# Patient Record
Sex: Male | Born: 1965 | Race: White | Hispanic: No | Smoking: Former smoker
Health system: Southern US, Community
[De-identification: ages and names within clinical notes are randomized; demographics above are authoritative.]

## PROBLEM LIST (undated history)

## (undated) DIAGNOSIS — K759 Inflammatory liver disease, unspecified: Secondary | ICD-10-CM

## (undated) DIAGNOSIS — Z9889 Other specified postprocedural states: Secondary | ICD-10-CM

## (undated) DIAGNOSIS — Z87442 Personal history of urinary calculi: Secondary | ICD-10-CM

## (undated) HISTORY — PX: COLONOSCOPY: SHX174

## (undated) HISTORY — PX: OTHER SURGICAL HISTORY: SHX169

---

## 1998-09-12 ENCOUNTER — Emergency Department (HOSPITAL_COMMUNITY): Admission: EM | Admit: 1998-09-12 | Discharge: 1998-09-12 | Payer: Self-pay

## 2010-03-22 ENCOUNTER — Emergency Department (HOSPITAL_COMMUNITY): Admission: EM | Admit: 2010-03-22 | Discharge: 2010-03-22 | Payer: Self-pay | Admitting: Family Medicine

## 2010-03-26 ENCOUNTER — Observation Stay (HOSPITAL_COMMUNITY): Admission: EM | Admit: 2010-03-26 | Discharge: 2010-03-27 | Payer: Self-pay | Admitting: Emergency Medicine

## 2010-03-26 ENCOUNTER — Emergency Department (HOSPITAL_COMMUNITY): Admission: EM | Admit: 2010-03-26 | Discharge: 2010-03-26 | Payer: Self-pay | Admitting: Family Medicine

## 2010-03-27 ENCOUNTER — Encounter (HOSPITAL_BASED_OUTPATIENT_CLINIC_OR_DEPARTMENT_OTHER): Admission: RE | Admit: 2010-03-27 | Discharge: 2010-04-27 | Payer: Self-pay | Admitting: General Surgery

## 2010-10-13 LAB — POCT I-STAT, CHEM 8
BUN: 17 mg/dL (ref 6–23)
Calcium, Ion: 1.18 mmol/L (ref 1.12–1.32)
Chloride: 107 mEq/L (ref 96–112)
Creatinine, Ser: 1.2 mg/dL (ref 0.4–1.5)
Glucose, Bld: 94 mg/dL (ref 70–99)
HCT: 50 % (ref 39.0–52.0)
Hemoglobin: 17 g/dL (ref 13.0–17.0)
Potassium: 4.6 mEq/L (ref 3.5–5.1)
Sodium: 140 mEq/L (ref 135–145)
TCO2: 27 mmol/L (ref 0–100)

## 2010-10-13 LAB — DIFFERENTIAL
Basophils Absolute: 0 10*3/uL (ref 0.0–0.1)
Basophils Relative: 0 % (ref 0–1)
Eosinophils Absolute: 0.1 10*3/uL (ref 0.0–0.7)
Eosinophils Relative: 1 % (ref 0–5)
Lymphocytes Relative: 19 % (ref 12–46)
Lymphs Abs: 2.2 10*3/uL (ref 0.7–4.0)
Monocytes Absolute: 1.6 10*3/uL — ABNORMAL HIGH (ref 0.1–1.0)
Monocytes Relative: 14 % — ABNORMAL HIGH (ref 3–12)
Neutro Abs: 7.6 10*3/uL (ref 1.7–7.7)
Neutrophils Relative %: 66 % (ref 43–77)

## 2010-10-13 LAB — CBC
HCT: 46.9 % (ref 39.0–52.0)
Hemoglobin: 16.4 g/dL (ref 13.0–17.0)
MCH: 33.5 pg (ref 26.0–34.0)
MCHC: 35 g/dL (ref 30.0–36.0)
MCV: 95.9 fL (ref 78.0–100.0)
Platelets: 237 10*3/uL (ref 150–400)
RBC: 4.89 MIL/uL (ref 4.22–5.81)
RDW: 13 % (ref 11.5–15.5)
WBC: 11.5 10*3/uL — ABNORMAL HIGH (ref 4.0–10.5)

## 2013-05-23 ENCOUNTER — Emergency Department (HOSPITAL_COMMUNITY)
Admission: EM | Admit: 2013-05-23 | Discharge: 2013-05-23 | Disposition: A | Discharge status: Home / Self Care | Payer: Self-pay | Attending: Emergency Medicine | Admitting: Emergency Medicine

## 2013-05-23 ENCOUNTER — Emergency Department (HOSPITAL_COMMUNITY): Payer: Self-pay

## 2013-05-23 ENCOUNTER — Encounter (HOSPITAL_COMMUNITY): Payer: Self-pay | Admitting: Emergency Medicine

## 2013-05-23 DIAGNOSIS — N200 Calculus of kidney: Secondary | ICD-10-CM | POA: Insufficient documentation

## 2013-05-23 DIAGNOSIS — F17200 Nicotine dependence, unspecified, uncomplicated: Secondary | ICD-10-CM | POA: Insufficient documentation

## 2013-05-23 LAB — URINALYSIS W MICROSCOPIC + REFLEX CULTURE
Glucose, UA: NEGATIVE mg/dL
Ketones, ur: NEGATIVE mg/dL
Nitrite: NEGATIVE
Protein, ur: 100 mg/dL — AB
Specific Gravity, Urine: 1.038 — ABNORMAL HIGH (ref 1.005–1.030)
Urobilinogen, UA: 1 mg/dL (ref 0.0–1.0)
pH: 6 (ref 5.0–8.0)

## 2013-05-23 LAB — COMPREHENSIVE METABOLIC PANEL
ALT: 42 U/L (ref 0–53)
Alkaline Phosphatase: 96 U/L (ref 39–117)
BUN: 18 mg/dL (ref 6–23)
CO2: 23 mEq/L (ref 19–32)
Calcium: 9.7 mg/dL (ref 8.4–10.5)
GFR calc Af Amer: 81 mL/min — ABNORMAL LOW (ref >90)
GFR calc non Af Amer: 70 mL/min — ABNORMAL LOW (ref >90)
Glucose, Bld: 140 mg/dL — ABNORMAL HIGH (ref 70–99)
Potassium: 4.8 mEq/L (ref 3.5–5.1)
Sodium: 137 mEq/L (ref 135–145)
Total Protein: 7.5 g/dL (ref 6.0–8.3)

## 2013-05-23 LAB — CBC WITH DIFFERENTIAL/PLATELET
Basophils Absolute: 0 10*3/uL (ref 0.0–0.1)
Basophils Relative: 0 % (ref 0–1)
Eosinophils Absolute: 0 10*3/uL (ref 0.0–0.7)
Eosinophils Relative: 0 % (ref 0–5)
HCT: 49.1 % (ref 39.0–52.0)
Hemoglobin: 17.2 g/dL — ABNORMAL HIGH (ref 13.0–17.0)
Lymphs Abs: 2.2 10*3/uL (ref 0.7–4.0)
MCH: 33.6 pg (ref 26.0–34.0)
MCHC: 35 g/dL (ref 30.0–36.0)
MCV: 95.9 fL (ref 78.0–100.0)
Monocytes Absolute: 1.1 10*3/uL — ABNORMAL HIGH (ref 0.1–1.0)
Neutro Abs: 7.8 10*3/uL — ABNORMAL HIGH (ref 1.7–7.7)
Neutrophils Relative %: 70 % (ref 43–77)
Platelets: 255 10*3/uL (ref 150–400)
RBC: 5.12 MIL/uL (ref 4.22–5.81)
RDW: 13.2 % (ref 11.5–15.5)
WBC: 11.1 10*3/uL — ABNORMAL HIGH (ref 4.0–10.5)

## 2013-05-23 MED ORDER — ONDANSETRON 8 MG PO TBDP
8.0000 mg | ORAL_TABLET | Freq: Once | ORAL | Status: AC
  Administered 2013-05-23: 8 mg via ORAL
  Filled 2013-05-23: qty 1

## 2013-05-23 MED ORDER — SODIUM CHLORIDE 0.9 % IV SOLN
1000.0000 mL | Freq: Once | INTRAVENOUS | Status: AC
Start: 2013-05-23 — End: 2013-05-23
  Administered 2013-05-23: 1000 mL via INTRAVENOUS

## 2013-05-23 MED ORDER — OXYCODONE-ACETAMINOPHEN 5-325 MG PO TABS
1.0000 | ORAL_TABLET | Freq: Once | ORAL | Status: AC
  Administered 2013-05-23: 1 via ORAL
  Filled 2013-05-23: qty 1

## 2013-05-23 MED ORDER — HYDROMORPHONE HCL PF 1 MG/ML IJ SOLN
1.0000 mg | Freq: Once | INTRAMUSCULAR | Status: AC
  Administered 2013-05-23: 1 mg via INTRAVENOUS
  Filled 2013-05-23: qty 1

## 2013-05-23 MED ORDER — OXYCODONE-ACETAMINOPHEN 5-325 MG PO TABS: 1.0000 | ORAL_TABLET | ORAL | Status: DC | PRN

## 2013-05-23 MED ORDER — IBUPROFEN 800 MG PO TABS: 800.0000 mg | ORAL_TABLET | Freq: Three times a day (TID) | ORAL | Status: DC

## 2013-05-23 MED ORDER — TAMSULOSIN HCL 0.4 MG PO CAPS: 0.4000 mg | ORAL_CAPSULE | Freq: Every day | ORAL | Status: DC

## 2013-05-23 MED ORDER — KETOROLAC TROMETHAMINE 30 MG/ML IJ SOLN
30.0000 mg | Freq: Once | INTRAMUSCULAR | Status: AC
  Administered 2013-05-23: 30 mg via INTRAVENOUS
  Filled 2013-05-23: qty 1

## 2013-05-23 MED ORDER — TAMSULOSIN HCL 0.4 MG PO CAPS
0.4000 mg | ORAL_CAPSULE | Freq: Once | ORAL | Status: AC
  Administered 2013-05-23: 0.4 mg via ORAL
  Filled 2013-05-23: qty 1

## 2013-05-23 NOTE — ED Provider Notes (Signed)
CSN: [id number]     Arrival date & time 05/23/13  0820 History   First MD Initiated Contact with Patient 05/23/13 [phone]     Chief Complaint  Patient presents with  . Flank Pain  . Back Pain   (Consider location/radiation/quality/duration/timing/severity/associated sxs/prior Treatment) HPI Mathew Sexton is a 47 y.o. male who presents to emergency department with complaint of right flank pain. Patient states that he was asleep and was awoken by severe sharp pain in the right lower back. States pain does not radiate although he did tell the nurse at some point that he did have pain in his right groin. Patient states she had associated nausea and vomiting. Denies any diarrhea. Denies any urinary symptoms however states his urine did look dark. Patient states pain is constant. States it is worse with movement and palpation of his abdomen however he denies any abdominal pain. Patient denies any back injuries or back pain radiating down to his legs. States that he did bend over a lot yesterday and played portable. States no pain prior to going to bed. Patient also states that he has family history of kidney stones. Patient did not take any medications prior to coming in   History reviewed. No pertinent past medical history. History reviewed. No pertinent past surgical history. No family history on file. History  Substance Use Topics  . Smoking status: Current Every Day Smoker -- 1.00 packs/day    Types: Cigarettes  . Smokeless tobacco: Never Used  . Alcohol Use: No    Review of Systems  Constitutional: Negative for fever and chills.  Respiratory: Negative for cough, chest tightness and shortness of breath.   Cardiovascular: Negative for chest pain, palpitations and leg swelling.  Gastrointestinal: Positive for nausea and vomiting. Negative for abdominal pain, diarrhea and abdominal distention.  Genitourinary: Positive for flank pain. Negative for dysuria, urgency, frequency, hematuria, scrotal  swelling and testicular pain.  Musculoskeletal: Negative for arthralgias, myalgias, neck pain and neck stiffness.  Skin: Negative for rash.  Allergic/Immunologic: Negative for immunocompromised state.  Neurological: Negative for dizziness, weakness, light-headedness, numbness and headaches.    Allergies  Review of patient's allergies indicates no known allergies.  Home Medications   Current Outpatient Rx  Name  Route  Sig  Dispense  Refill  . acetaminophen (TYLENOL) 325 MG tablet   Oral   Take 650 mg by mouth every 6 (six) hours as needed for pain.         Marland Kitchen ibuprofen (ADVIL,MOTRIN) 200 MG tablet   Oral   Take 400 mg by mouth every 6 (six) hours as needed for pain.          BP 140/74  Pulse 60  Temp(Src) 97.7 F (36.5 C)  Resp 24  SpO2 99% Physical Exam  Nursing note and vitals reviewed. Constitutional: He is oriented to person, place, and time. He appears well-developed and well-nourished. No distress.  HENT:  Head: Normocephalic.  Eyes: Conjunctivae are normal.  Neck: Neck supple.  Cardiovascular: Normal rate, regular rhythm and normal heart sounds.   Pulmonary/Chest: Effort normal and breath sounds normal. No respiratory distress. He has no wheezes. He has no rales.  Abdominal: Soft. Bowel sounds are normal. He exhibits no distension. There is no tenderness. There is no rebound.  Right CVA tenderness  Musculoskeletal:  The pain moved bilateral straight legraise  Neurological: He is alert and oriented to person, place, and time.    ED Course  Procedures (including critical care time) Labs Review Labs  Reviewed  URINALYSIS W MICROSCOPIC + REFLEX CULTURE - Abnormal; Notable for the following:    Color, Urine RED (*)    APPearance TURBID (*)    Specific Gravity, Urine 1.038 (*)    Hgb urine dipstick LARGE (*)    Bilirubin Urine MODERATE (*)    Protein, ur 100 (*)    Leukocytes, UA SMALL (*)    Bacteria, UA MANY (*)    All other components within normal  limits  CBC WITH DIFFERENTIAL - Abnormal; Notable for the following:    WBC 11.1 (*)    Hemoglobin 17.2 (*)    Neutro Abs 7.8 (*)    Monocytes Absolute 1.1 (*)    All other components within normal limits  COMPREHENSIVE METABOLIC PANEL - Abnormal; Notable for the following:    Glucose, Bld 140 (*)    AST 39 (*)    GFR calc non Af Amer 70 (*)    GFR calc Af Amer 81 (*)    All other components within normal limits   Imaging Review Ct Abdomen Pelvis Wo Contrast  05/23/2013   CLINICAL DATA:  Right flank pain  EXAM: CT ABDOMEN AND PELVIS WITHOUT CONTRAST  TECHNIQUE: Multidetector CT imaging of the abdomen and pelvis was performed following the standard protocol without intravenous contrast.  COMPARISON:  None.  FINDINGS: Bilateral lower lobe mild atelectasis. No acute musculoskeletal abnormalities.  Liver and gallbladder are normal. Spleen is normal. Pancreas and adrenal glands are normal. Left kidney is normal. Bladder is normal. There is a small hiatal hernia. Aorta and bowel appear normal. Reproductive organs are normal. No significant adenopathy. No ascites.  There is a 2 mm stone in the right renal midpole. There is mild right perinephric inflammatory change. There is mild dilatation of the collecting system and mild to moderate dilatation of the right ureter. There is mild periureteral inflammatory change. There is a calculus at the right ureteral vesicle junction, measuring 5 mm. The appendix appears normal. There is mild bilateral hip arthritis.  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 There is a nonobstructing 2 mm renal calculus on the right as well.   Electronically Signed   By: Esperanza Heir M.D.   On: 05/23/2013 10:30    EKG Interpretation   None       MDM   1. Kidney stone     Patient with a right lower back and flank pain. No history of the same. No recent back injuries. No urinary symptoms.  Patient's urine did have a large hemoglobin CT of the abdomen and pelvis obtained and showed a 5 mm stone at the UVJ with mild to moderate obstructive nephropathy. Will get pain controlled. Pt is afebrile, ua showed many bacteria, clinically no signs of infection at this time. Urine cultures sent.   12:15 PM Patient received several doses of IV Dilaudid for pain, he does of IV Toradol, oral Percocet. His pain is minimal at this time. States pain is 1/10. He is comfortable going home. Will refer to urology  Home with Flomax, ibuprofen, Percocet.  Filed Vitals:   05/23/13 0827 05/23/13 1002  BP: 140/74 124/71  Pulse: 60 59  Temp: 97.7 F (36.5 C) 98 F (36.7 C)  TempSrc:  Oral  Resp: 24   SpO2: 99% 95%       Nobel Brar A Dam Ashraf, PA-C 05/23/13 1524

## 2013-05-23 NOTE — ED Notes (Signed)
Pain is more so in back

## 2013-05-23 NOTE — ED Notes (Addendum)
Pt given urinal and made aware of need for UA instructed to ring call bell when urine obtained

## 2013-05-23 NOTE — ED Notes (Signed)
MD at bedside. 

## 2013-05-23 NOTE — ED Notes (Signed)
Patient complaining of right back and flank pain. No hx of kidney stones. However, patient states that his father has had them.

## 2013-05-23 NOTE — ED Provider Notes (Signed)
Medical screening examination/treatment/procedure(s) were performed by non-physician practitioner and as supervising physician I was immediately available for consultation/collaboration.  Flint Melter, MD 05/23/13 1600

## 2014-08-02 ENCOUNTER — Other Ambulatory Visit (HOSPITAL_COMMUNITY): Payer: Self-pay | Admitting: Nurse Practitioner

## 2014-08-02 DIAGNOSIS — B1920 Unspecified viral hepatitis C without hepatic coma: Secondary | ICD-10-CM

## 2014-08-18 ENCOUNTER — Ambulatory Visit (HOSPITAL_COMMUNITY)
Admission: RE | Admit: 2014-08-18 | Discharge: 2014-08-18 | Disposition: A | Discharge status: Home / Self Care | Payer: BLUE CROSS/BLUE SHIELD | Source: Ambulatory Visit | Attending: Nurse Practitioner | Admitting: Nurse Practitioner

## 2014-08-18 DIAGNOSIS — K760 Fatty (change of) liver, not elsewhere classified: Secondary | ICD-10-CM | POA: Insufficient documentation

## 2014-08-18 DIAGNOSIS — B1920 Unspecified viral hepatitis C without hepatic coma: Secondary | ICD-10-CM | POA: Insufficient documentation

## 2014-12-26 IMAGING — CT CT ABD-PELV W/O CM
1 series · 15 of 28 positions shown, 19 images · non-contrast
Comparison: None.

CLINICAL DATA: Right flank pain

EXAM:
CT ABDOMEN AND PELVIS WITHOUT CONTRAST
TECHNIQUE: Multidetector CT imaging of the abdomen and pelvis was performed
following the standard protocol without intravenous contrast.

[Series 4: lung · axial · 0.83mm/px · z∈[-188,-68]mm · 15 of 28 slices shown, 19 images]
[im 3/28  soft-tissue]
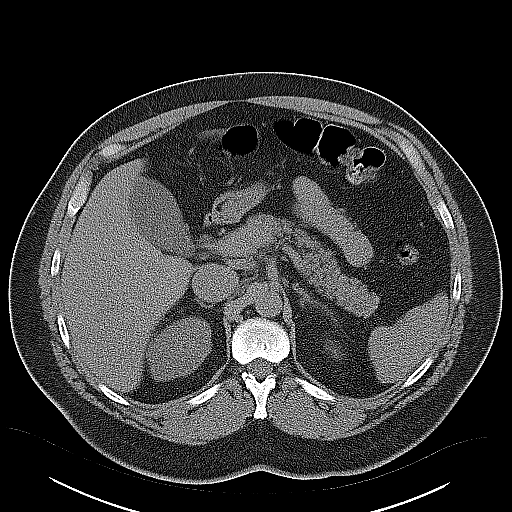
[im 3/28  bone]
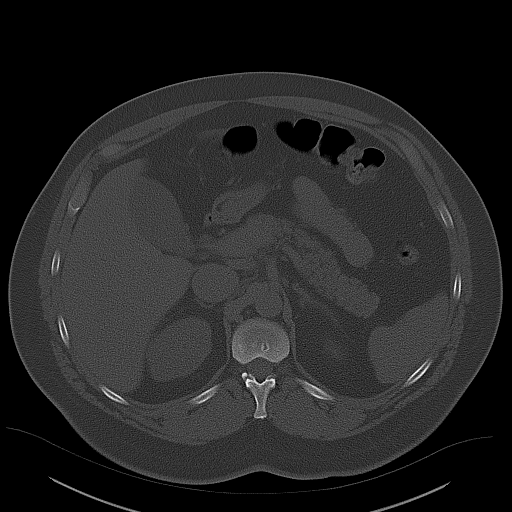
[im 5/28  soft-tissue]
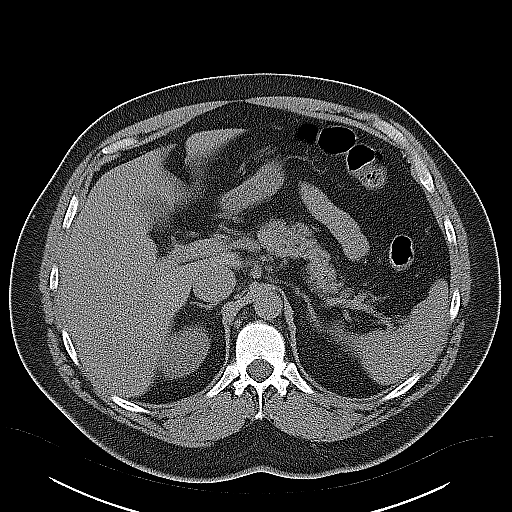
[im 7/28  soft-tissue]
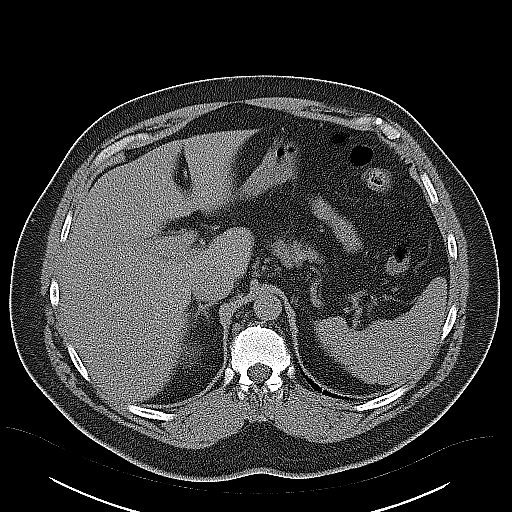
[im 9/28  soft-tissue]
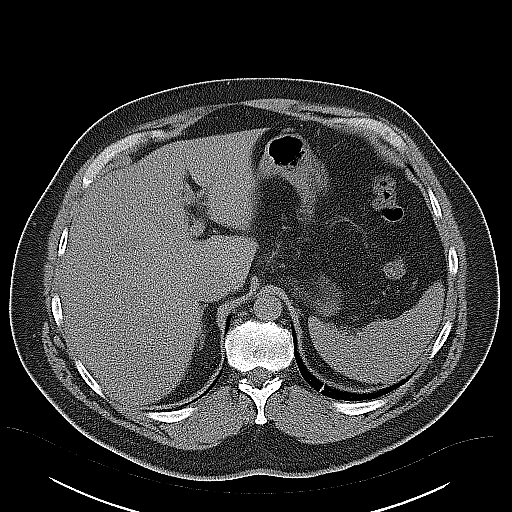
[im 11/28  soft-tissue]
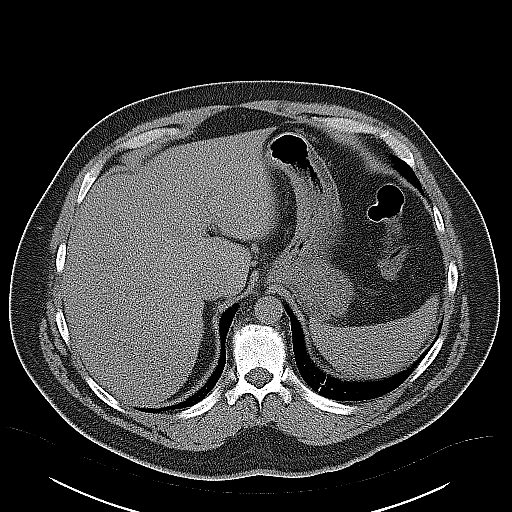
[im 13/28  soft-tissue]
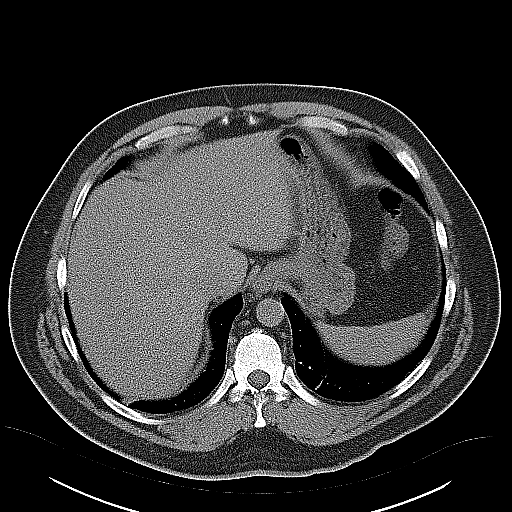
[im 15/28  soft-tissue]
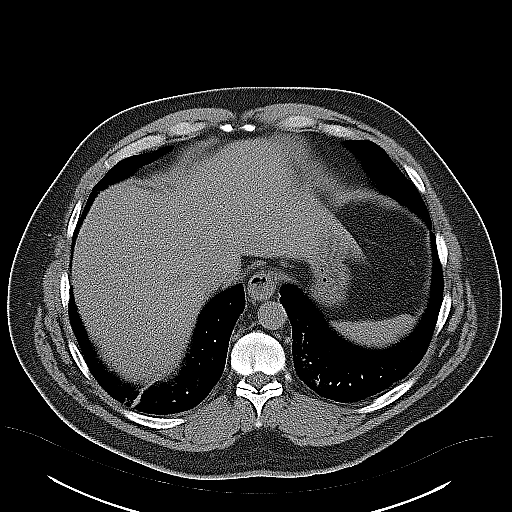
[im 17/28  soft-tissue]
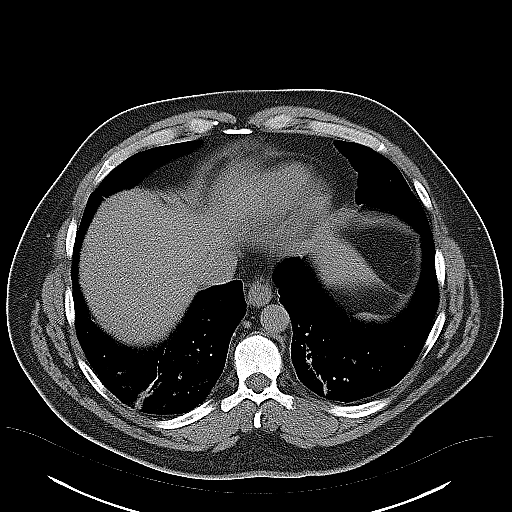
[im 19/28  soft-tissue]
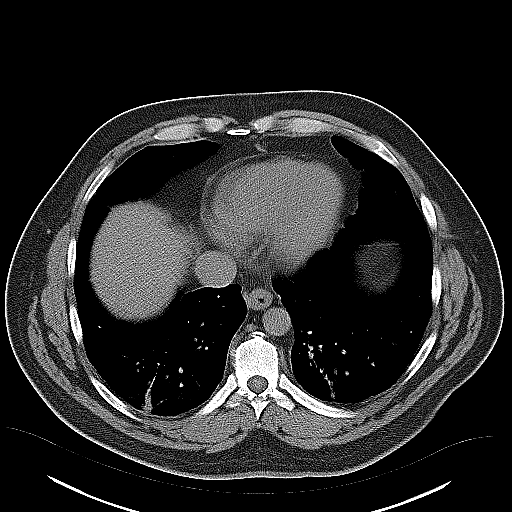
[im 19/28  bone]
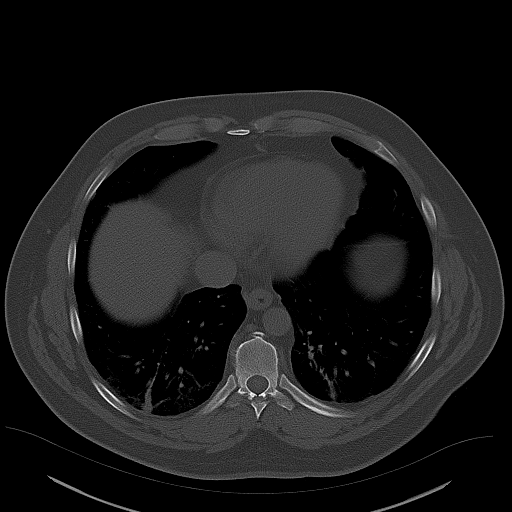
[im 21/28  soft-tissue]
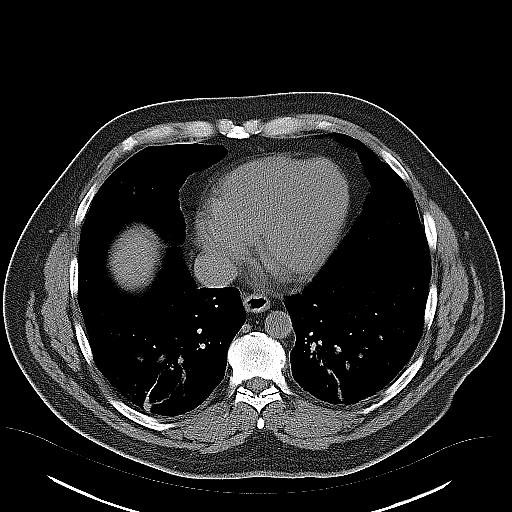
[im 23/28  soft-tissue]
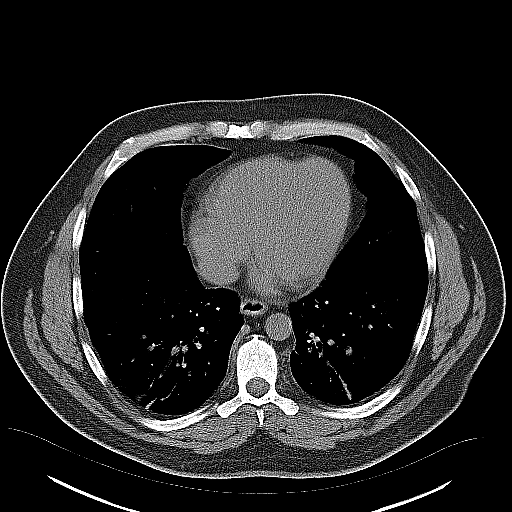
[im 24/28  lung]
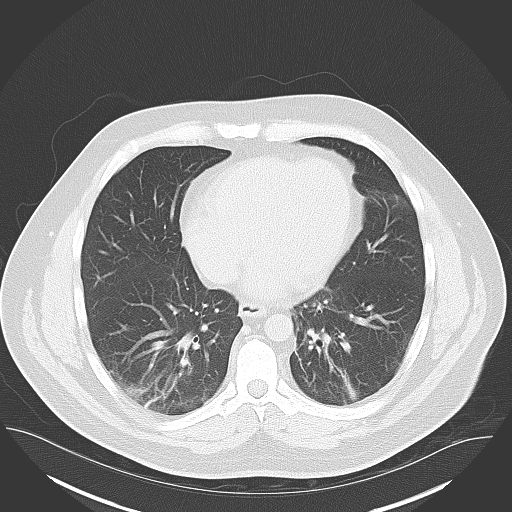
[im 25/28  soft-tissue]
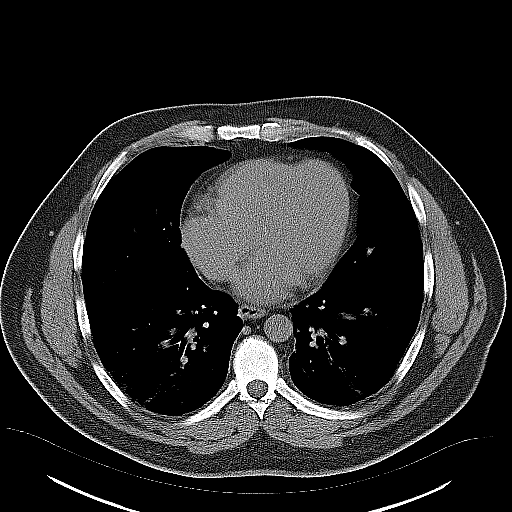
[im 25/28  lung]
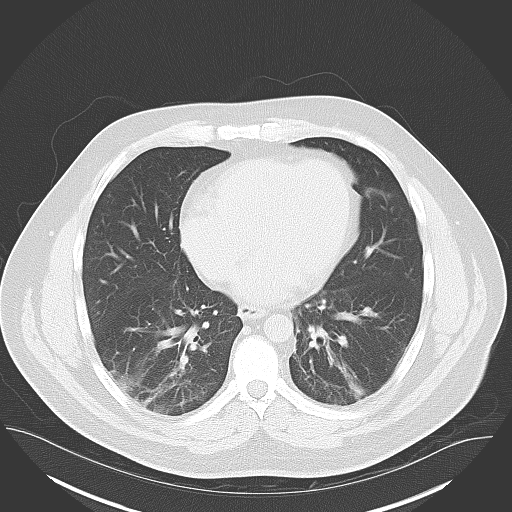
[im 26/28  lung]
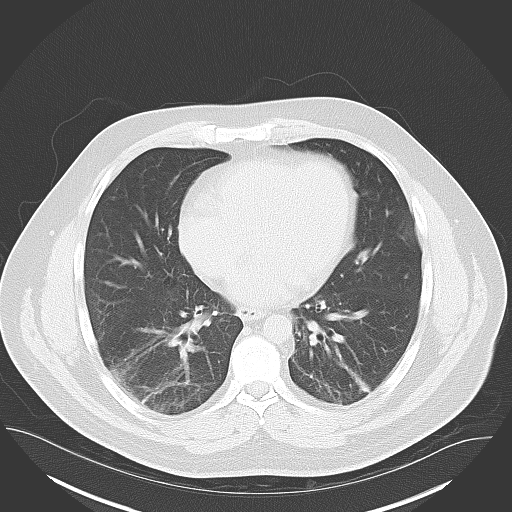
[im 27/28  soft-tissue]
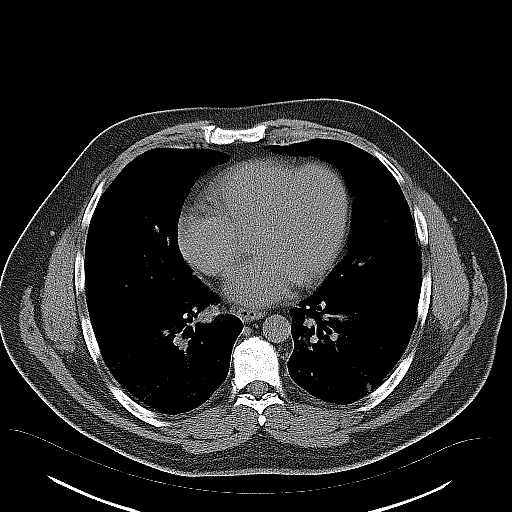
[im 27/28  lung]
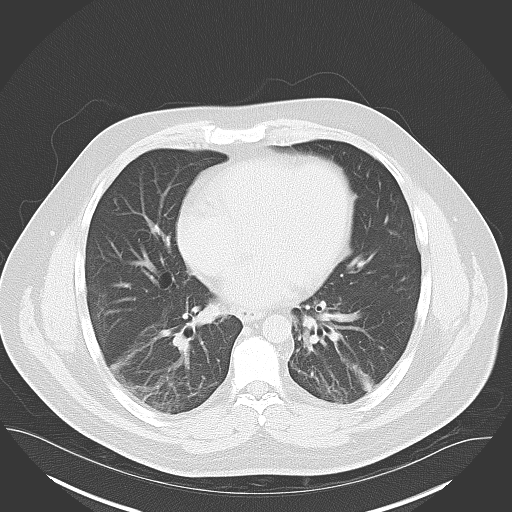

[15 of 28 positions shown; findings below may reference images not displayed]

FINDINGS: Bilateral lower lobe mild atelectasis. No acute musculoskeletal
abnormalities.

Liver and gallbladder are normal. Spleen is normal. Pancreas and
adrenal glands are normal. Left kidney is normal. Bladder is normal.
There is a small hiatal hernia. Aorta and bowel appear normal.
Reproductive organs are normal. No significant adenopathy. No
ascites.

There is a 2 mm stone in the right renal midpole. There is mild
right perinephric inflammatory change. There is mild dilatation of
the collecting system and mild to moderate dilatation of the right
ureter. There is mild periureteral inflammatory change. There is a
calculus at the right ureteral vesicle junction, measuring 5 mm. The
appendix appears normal. There is mild bilateral hip arthritis.
IMPRESSION: Mild to moderate obstructive nephropathy on the right due to a 5 mm
stone at the ureterovesical junction. There is a nonobstructing 2 mm
renal calculus on the right as well.

## 2015-05-13 ENCOUNTER — Other Ambulatory Visit: Payer: Self-pay | Admitting: Nurse Practitioner

## 2015-05-13 DIAGNOSIS — K7469 Other cirrhosis of liver: Secondary | ICD-10-CM

## 2015-05-24 ENCOUNTER — Ambulatory Visit
Admission: RE | Admit: 2015-05-24 | Discharge: 2015-05-24 | Disposition: A | Discharge status: Home / Self Care | Payer: BLUE CROSS/BLUE SHIELD | Source: Ambulatory Visit | Attending: Nurse Practitioner | Admitting: Nurse Practitioner

## 2015-05-24 DIAGNOSIS — K7469 Other cirrhosis of liver: Secondary | ICD-10-CM

## 2015-11-09 ENCOUNTER — Other Ambulatory Visit: Payer: Self-pay | Admitting: Nurse Practitioner

## 2015-11-09 DIAGNOSIS — K7469 Other cirrhosis of liver: Secondary | ICD-10-CM

## 2015-11-09 DIAGNOSIS — B182 Chronic viral hepatitis C: Secondary | ICD-10-CM | POA: Diagnosis not present

## 2015-11-09 DIAGNOSIS — K740 Hepatic fibrosis: Secondary | ICD-10-CM | POA: Diagnosis not present

## 2015-11-18 ENCOUNTER — Other Ambulatory Visit: Payer: BLUE CROSS/BLUE SHIELD

## 2016-03-22 IMAGING — US US ABDOMEN COMPLETE W/ ELASTOGRAPHY
1 series · 13 of 25 positions shown · non-contrast
Comparison: 05/23/2013

CLINICAL DATA: Hepatitis-C



[Series 1: us abdomen complete w/ elastography · 0.35mm/px · 13 of 82 slices shown]
[im 1/82]
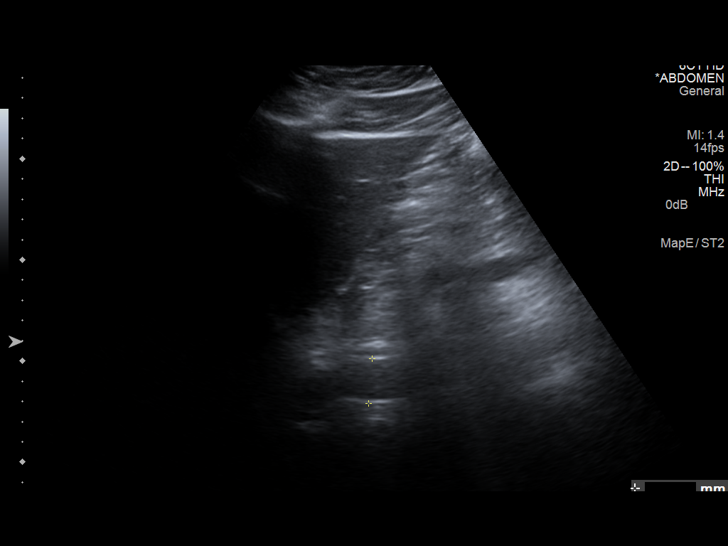
[im 7/82]
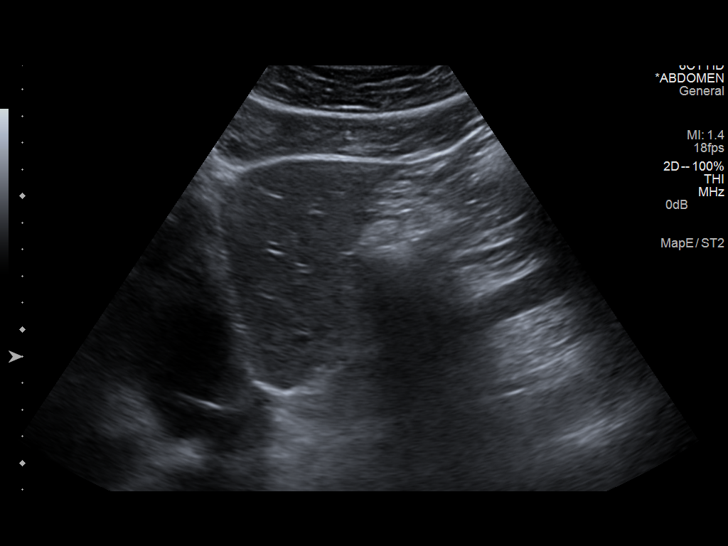
[im 14/82]
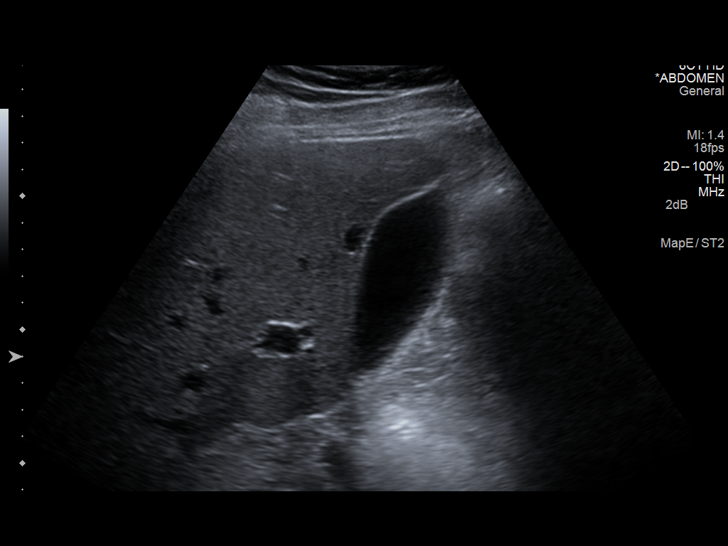
[im 21/82]
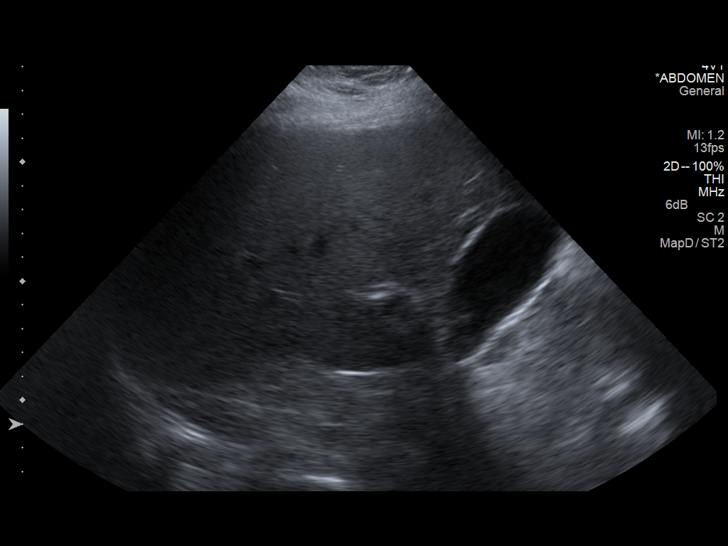
[im 28/82]
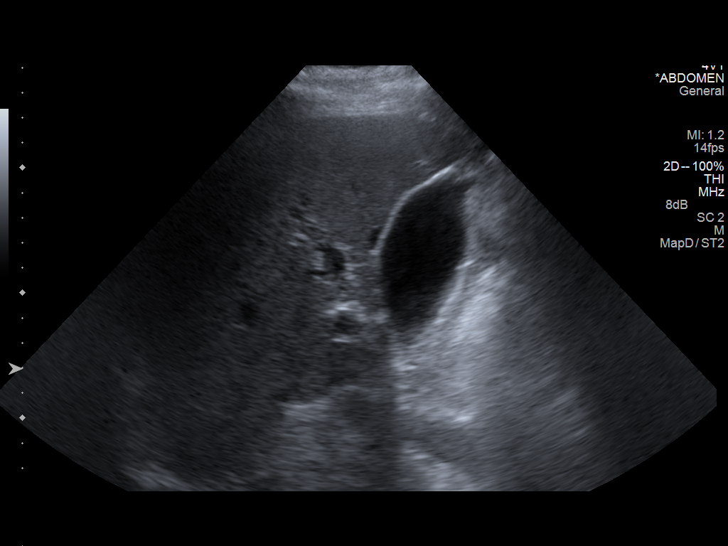
[im 34/82]
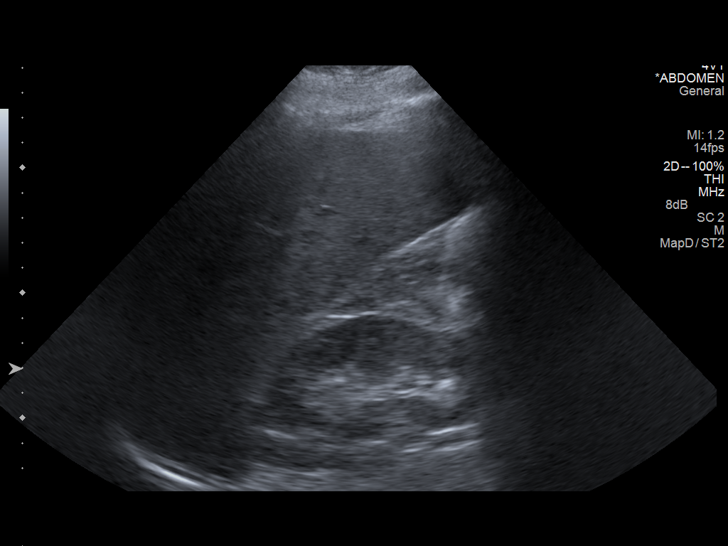
[im 41/82]
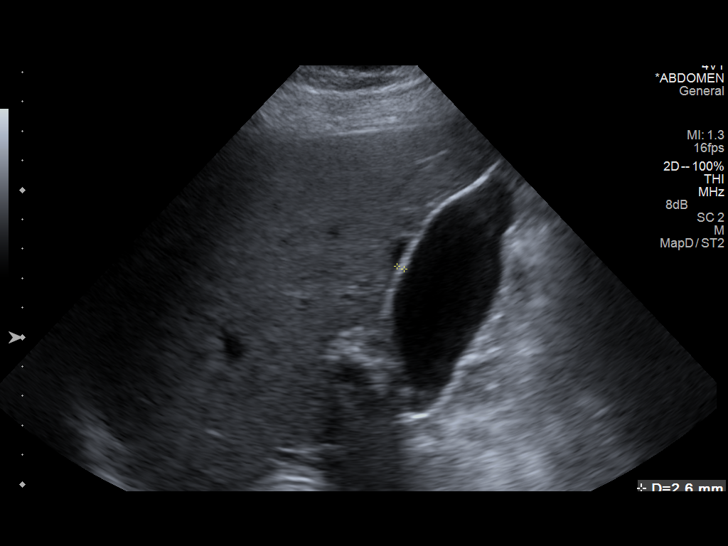
[im 48/82]
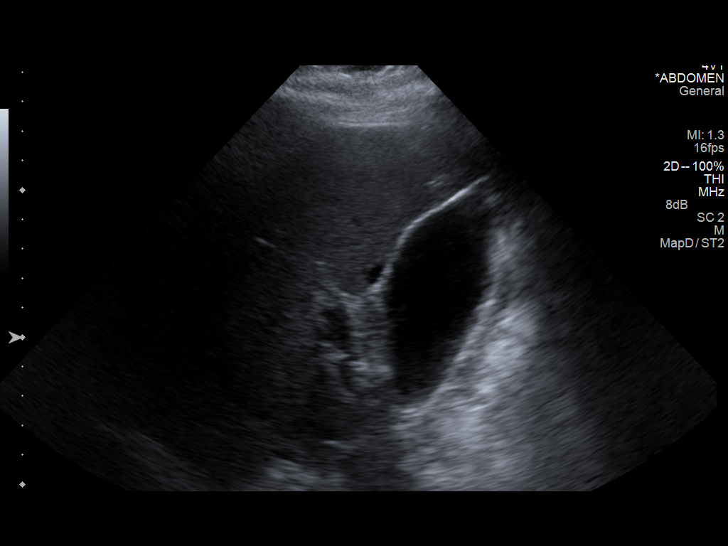
[im 55/82]
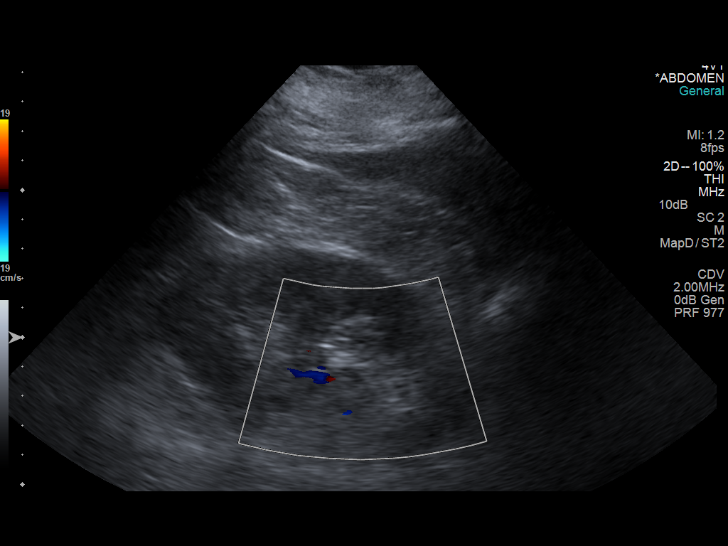
[im 61/82]
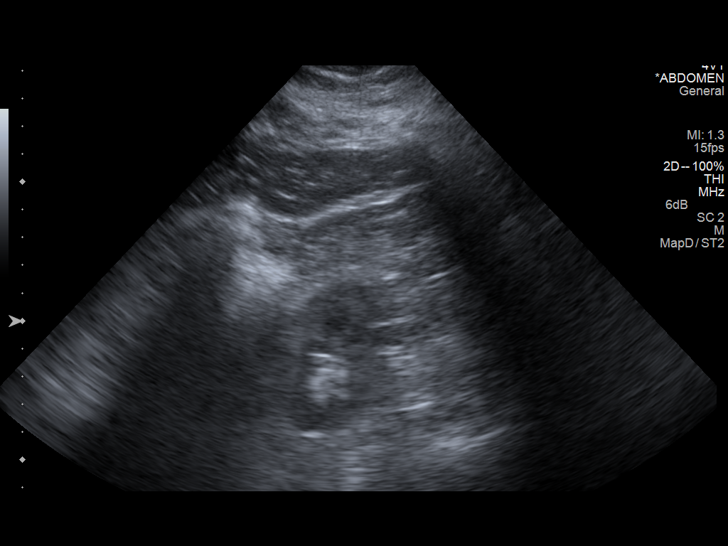
[im 68/82]
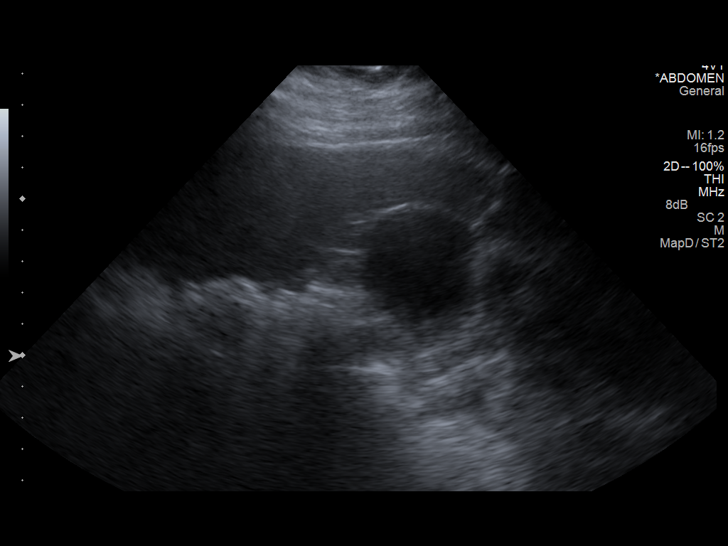
[im 75/82]
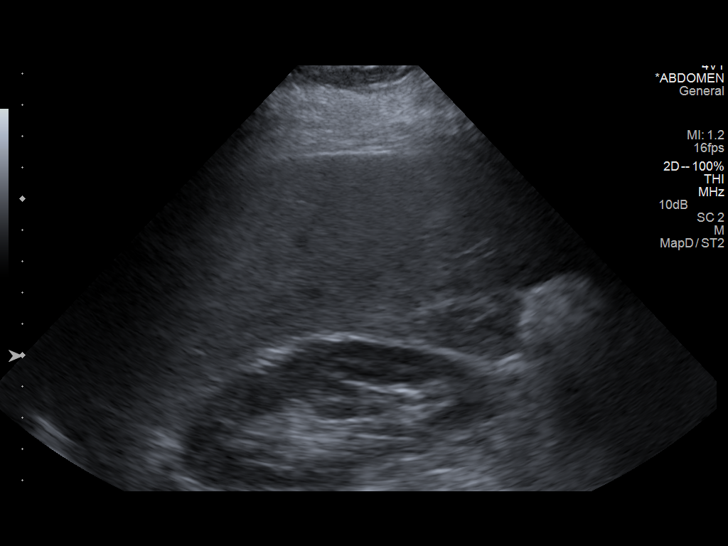
[im 82/82]
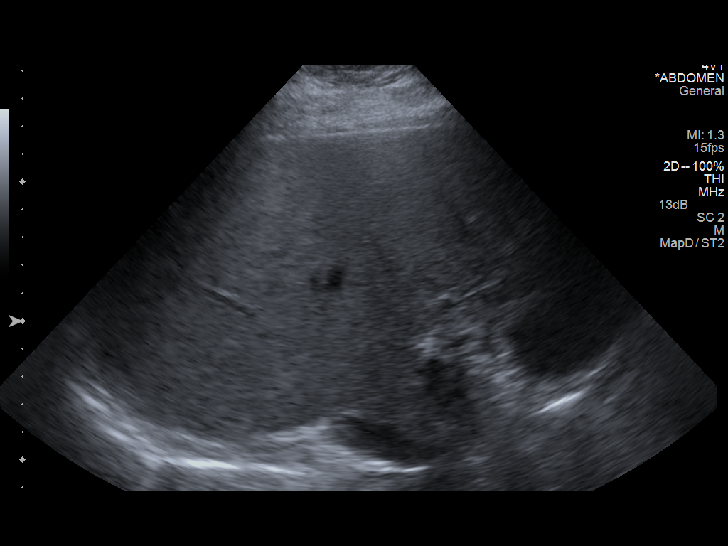

[13 of 25 positions shown; findings below may reference images not displayed]

FINDINGS: ULTRASOUND ABDOMEN

Gallbladder: No gallstones or wall thickening visualized. No
sonographic Murphy sign noted.

Common bile duct: Diameter: 2.3 mm

Liver: No focal lesion identified.  The liver appears echogenic.

IVC: No abnormality visualized.

Pancreas: Visualized portion unremarkable.

Spleen: Size and appearance within normal limits.

Right Kidney: Length: 10.1 cm. Echogenicity within normal limits. No
mass or hydronephrosis visualized.

Left Kidney: Length: 10.9 cm. Echogenicity within normal limits. No
mass or hydronephrosis visualized.

Abdominal aorta: No aneurysm visualized.

Other findings: None.

ULTRASOUND HEPATIC ELASTOGRAPHY

Device: Siemens Helix VTQ

Transducer 4 V

Patient position: Left lateral decubitus

Number of measurements:  10

Hepatic Segment:  8

Median velocity:   2.78  M/sec

IQR:

IQR/Median velocity ratio

Corresponding Metavir fibrosis score:  Some F3 and F 4

Risk of fibrosis: High

Limitations of exam: None

Pertinent findings noted on other imaging exams:  None

Please note that abnormal shear wave velocities may also be
identified in clinical settings other than with hepatic fibrosis,
such as: acute hepatitis, elevated right heart and central venous
pressures including use of beta blockers, [place] disease
([place]), infiltrative processes such as
mastocytosis/amyloidosis/infiltrative tumor, extrahepatic
cholestasis, in the post-prandial state, and liver transplantation.
Correlation with patient history, laboratory data, and clinical
condition recommended.
IMPRESSION: 1. Mild hepatic steatosis.

Median hepatic shear wave velocity is calculated at 2.78 m/sec.

Corresponding Metavir fibrosis score is some F3 and  F4..

Risk of fibrosis is high.

Follow-up:  Followup advised

## 2016-05-12 DIAGNOSIS — M545 Low back pain: Secondary | ICD-10-CM | POA: Diagnosis not present

## 2016-07-31 DIAGNOSIS — K760 Fatty (change of) liver, not elsewhere classified: Secondary | ICD-10-CM | POA: Diagnosis not present

## 2016-07-31 DIAGNOSIS — K7469 Other cirrhosis of liver: Secondary | ICD-10-CM | POA: Diagnosis not present

## 2016-07-31 DIAGNOSIS — K740 Hepatic fibrosis: Secondary | ICD-10-CM | POA: Diagnosis not present

## 2016-08-01 ENCOUNTER — Other Ambulatory Visit: Payer: Self-pay | Admitting: Nurse Practitioner

## 2016-08-01 DIAGNOSIS — K7469 Other cirrhosis of liver: Secondary | ICD-10-CM

## 2016-08-15 ENCOUNTER — Other Ambulatory Visit: Payer: BLUE CROSS/BLUE SHIELD

## 2016-12-26 IMAGING — US US ABDOMEN LIMITED
1 series · 14 of 25 positions shown · non-contrast
Comparison: Limited abdominal ultrasound August 18, 2014

CLINICAL DATA: History of hepatic cirrhosis, hepatoma screening.

EXAM:
US ABDOMEN LIMITED - RIGHT UPPER QUADRANT

[Series 1: us abdomen limited · 0.22mm/px · 14 of 46 slices shown]
[im 1/46]
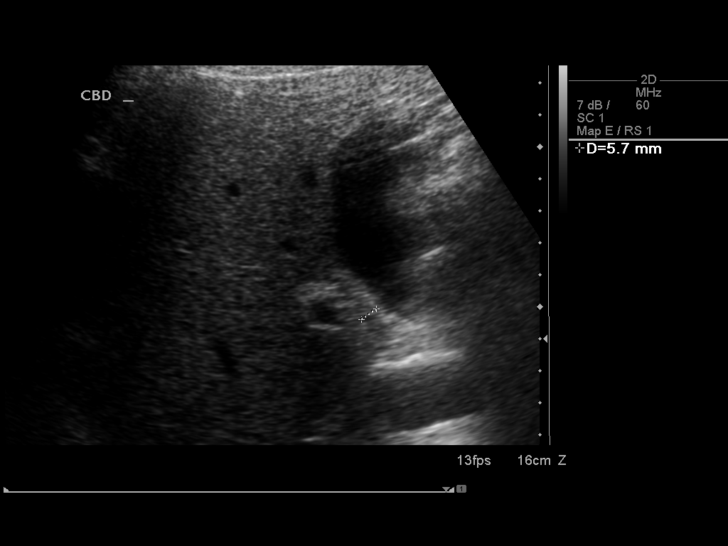
[im 4/46]
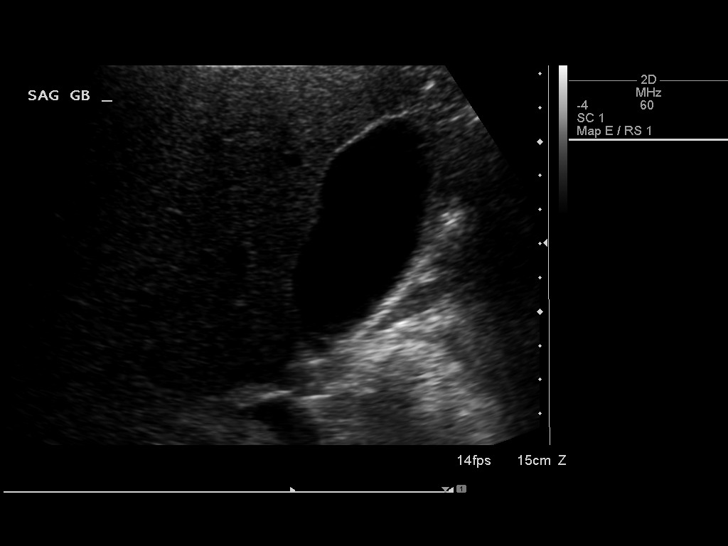
[im 8/46]
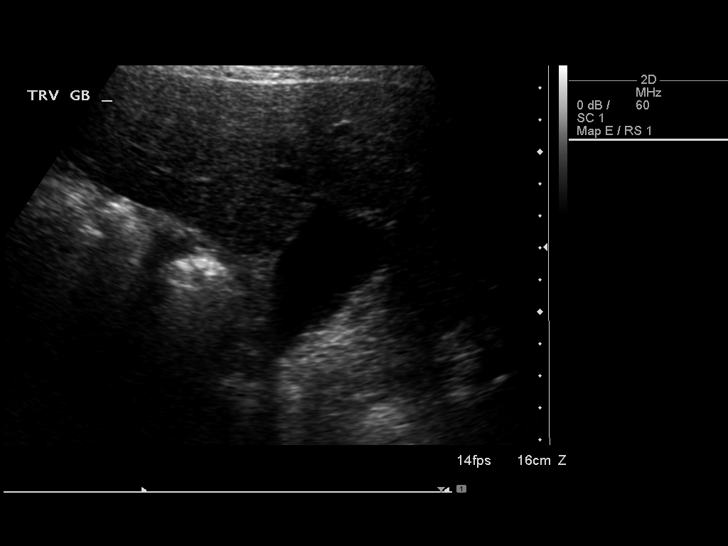
[im 12/46]
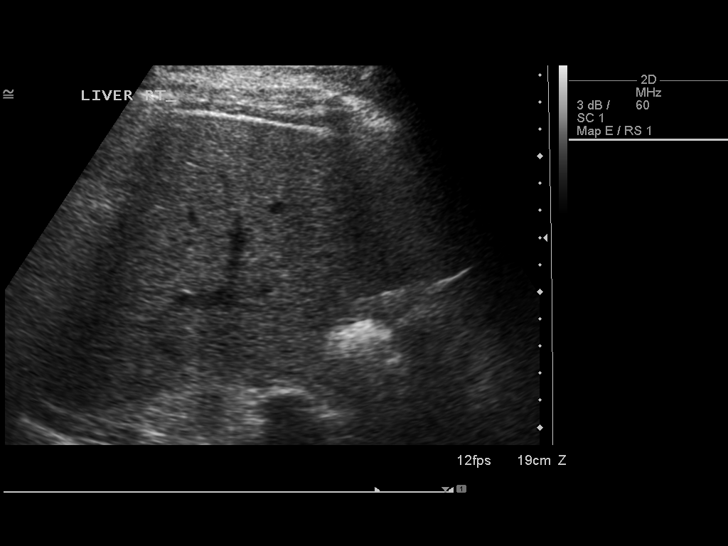
[im 16/46]
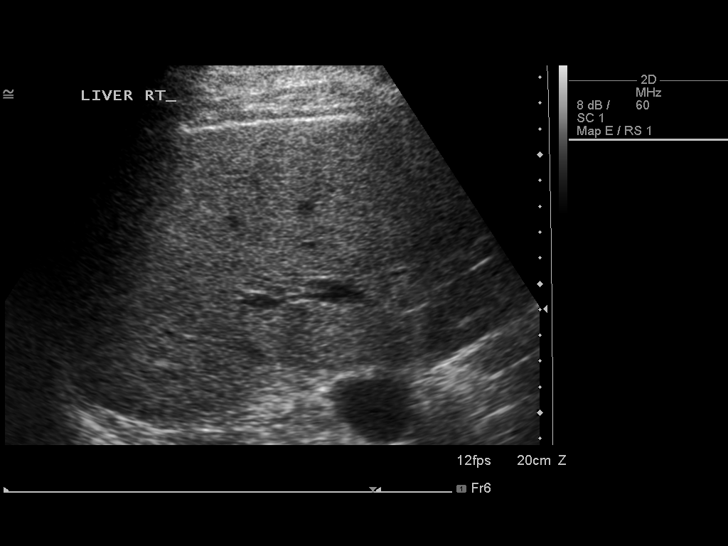
[im 17/46]
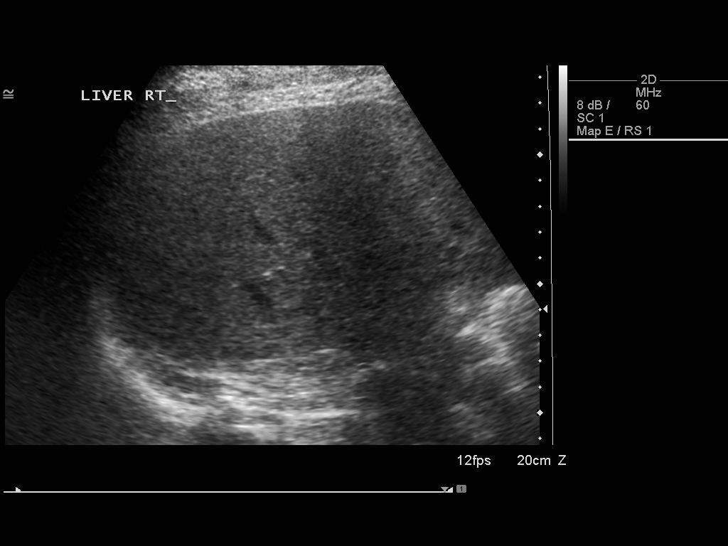
[im 21/46]
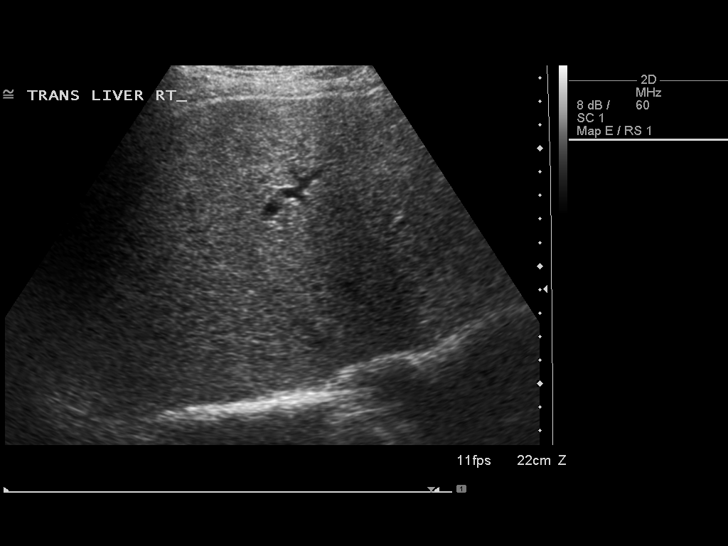
[im 25/46]
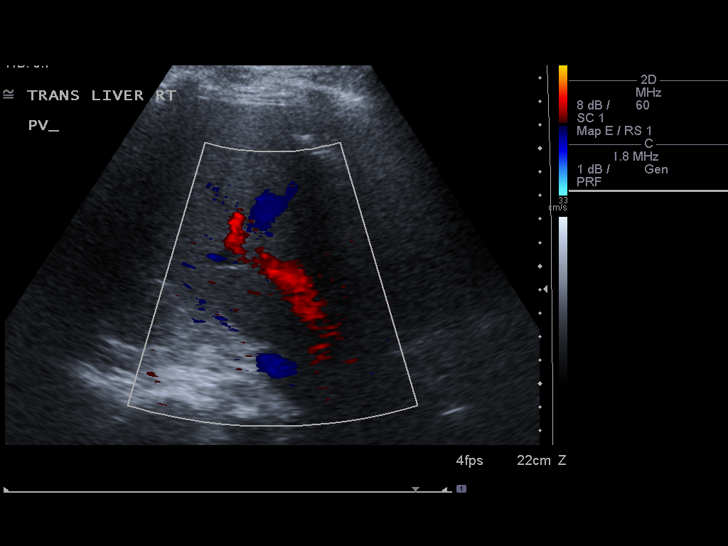
[im 29/46]
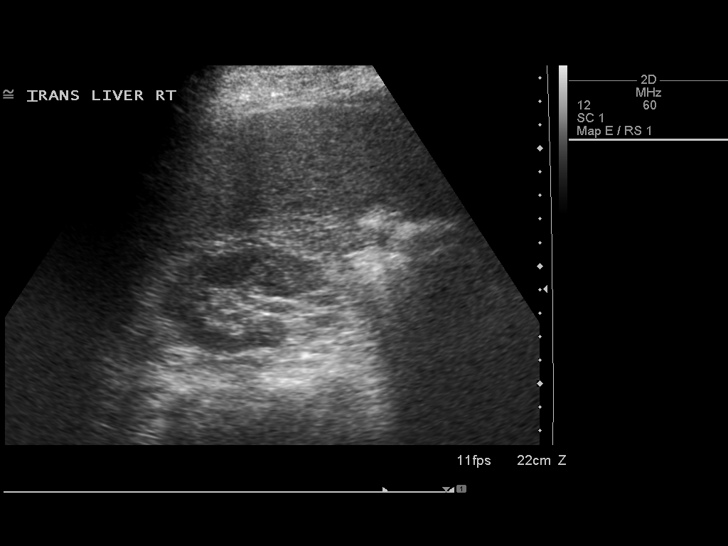
[im 31/46]
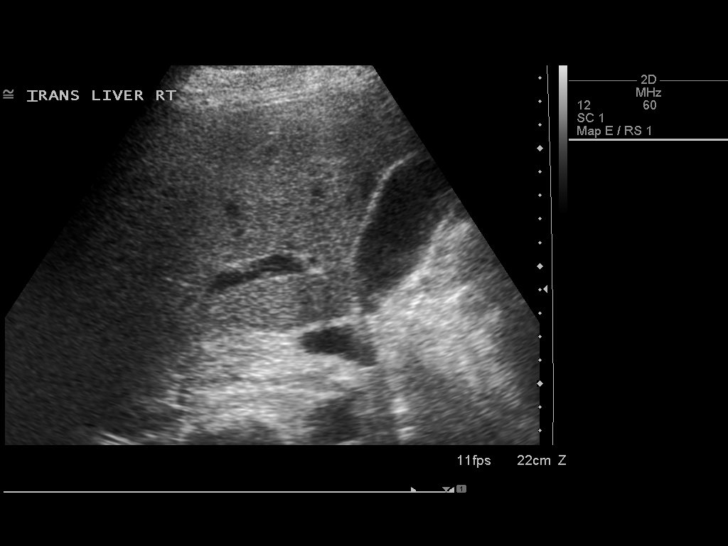
[im 34/46]
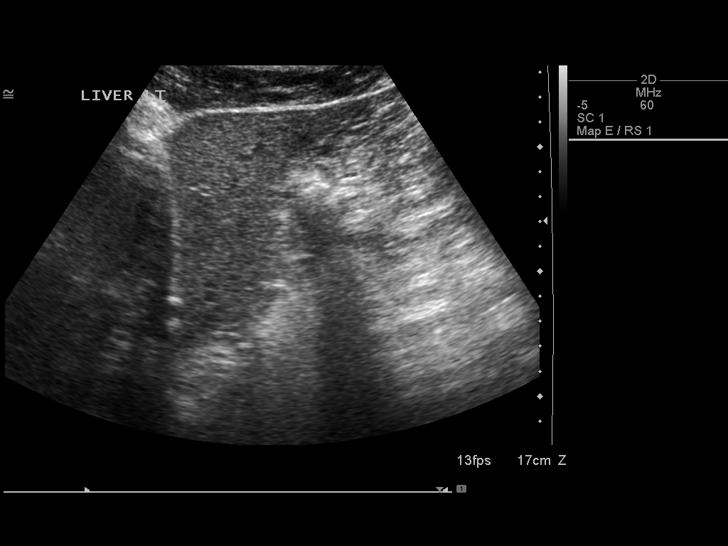
[im 38/46]
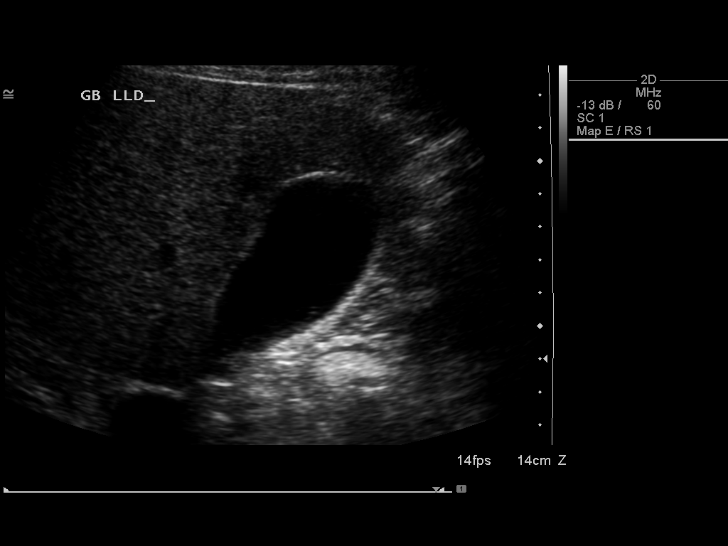
[im 42/46]
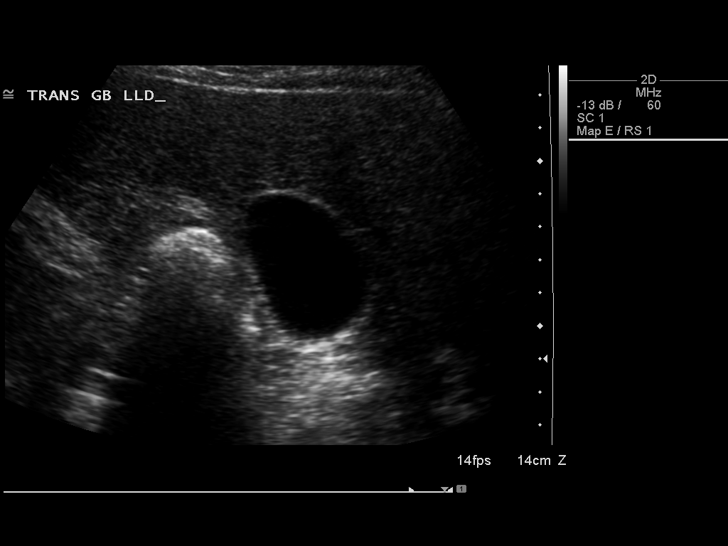
[im 46/46]
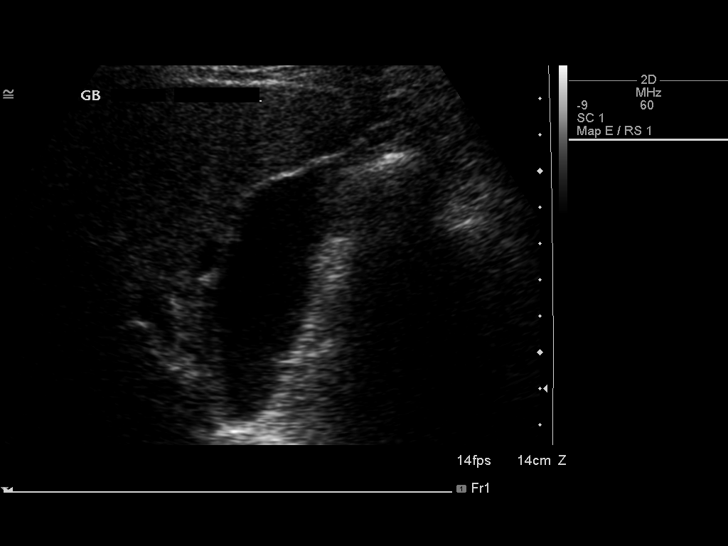

[14 of 25 positions shown; findings below may reference images not displayed]

FINDINGS: Gallbladder:

No gallstones or wall thickening visualized. No sonographic Murphy
sign noted.

Common bile duct:

Diameter: 5.7 mm

Liver:

The hepatic echotexture is heterogeneous. There is no focal mass nor
ductal dilation. The surface contour of the liver remains smooth.
IMPRESSION: Fatty infiltrative change of the liver, stable. No hepatic mass is
observed. The gallbladder and common bile duct exhibit no acute
abnormalities.

## 2017-08-26 DIAGNOSIS — L02512 Cutaneous abscess of left hand: Secondary | ICD-10-CM | POA: Diagnosis not present

## 2017-08-26 DIAGNOSIS — J069 Acute upper respiratory infection, unspecified: Secondary | ICD-10-CM | POA: Diagnosis not present

## 2018-03-30 DIAGNOSIS — H1031 Unspecified acute conjunctivitis, right eye: Secondary | ICD-10-CM | POA: Diagnosis not present

## 2018-04-02 DIAGNOSIS — H1033 Unspecified acute conjunctivitis, bilateral: Secondary | ICD-10-CM | POA: Diagnosis not present

## 2018-05-29 DIAGNOSIS — R05 Cough: Secondary | ICD-10-CM | POA: Diagnosis not present

## 2019-09-02 DIAGNOSIS — Z03818 Encounter for observation for suspected exposure to other biological agents ruled out: Secondary | ICD-10-CM | POA: Diagnosis not present

## 2020-03-18 DIAGNOSIS — Z87442 Personal history of urinary calculi: Secondary | ICD-10-CM | POA: Diagnosis not present

## 2020-03-18 DIAGNOSIS — Z87898 Personal history of other specified conditions: Secondary | ICD-10-CM | POA: Diagnosis not present

## 2020-03-18 DIAGNOSIS — N529 Male erectile dysfunction, unspecified: Secondary | ICD-10-CM | POA: Diagnosis not present

## 2020-03-18 DIAGNOSIS — E7800 Pure hypercholesterolemia, unspecified: Secondary | ICD-10-CM | POA: Diagnosis not present

## 2020-08-15 DIAGNOSIS — Z03818 Encounter for observation for suspected exposure to other biological agents ruled out: Secondary | ICD-10-CM | POA: Diagnosis not present

## 2020-08-15 DIAGNOSIS — Z20822 Contact with and (suspected) exposure to covid-19: Secondary | ICD-10-CM | POA: Diagnosis not present

## 2020-12-11 DIAGNOSIS — Z20822 Contact with and (suspected) exposure to covid-19: Secondary | ICD-10-CM | POA: Diagnosis not present

## 2020-12-12 DIAGNOSIS — Z20822 Contact with and (suspected) exposure to covid-19: Secondary | ICD-10-CM | POA: Diagnosis not present

## 2021-03-10 DIAGNOSIS — N529 Male erectile dysfunction, unspecified: Secondary | ICD-10-CM | POA: Diagnosis not present

## 2021-03-10 DIAGNOSIS — Z6836 Body mass index (BMI) 36.0-36.9, adult: Secondary | ICD-10-CM | POA: Diagnosis not present

## 2021-03-10 DIAGNOSIS — E7800 Pure hypercholesterolemia, unspecified: Secondary | ICD-10-CM | POA: Diagnosis not present

## 2021-12-21 DIAGNOSIS — N529 Male erectile dysfunction, unspecified: Secondary | ICD-10-CM | POA: Diagnosis not present

## 2021-12-21 DIAGNOSIS — Z1211 Encounter for screening for malignant neoplasm of colon: Secondary | ICD-10-CM | POA: Diagnosis not present

## 2021-12-21 DIAGNOSIS — M67442 Ganglion, left hand: Secondary | ICD-10-CM | POA: Diagnosis not present

## 2021-12-21 DIAGNOSIS — E7800 Pure hypercholesterolemia, unspecified: Secondary | ICD-10-CM | POA: Diagnosis not present

## 2022-01-29 DIAGNOSIS — R2232 Localized swelling, mass and lump, left upper limb: Secondary | ICD-10-CM | POA: Diagnosis not present

## 2022-02-05 DIAGNOSIS — L980 Pyogenic granuloma: Secondary | ICD-10-CM | POA: Diagnosis not present

## 2022-02-05 DIAGNOSIS — R2232 Localized swelling, mass and lump, left upper limb: Secondary | ICD-10-CM | POA: Diagnosis not present

## 2022-02-09 DIAGNOSIS — D1801 Hemangioma of skin and subcutaneous tissue: Secondary | ICD-10-CM | POA: Diagnosis not present

## 2022-02-09 DIAGNOSIS — D1809 Hemangioma of other sites: Secondary | ICD-10-CM | POA: Diagnosis not present

## 2022-02-09 DIAGNOSIS — R2232 Localized swelling, mass and lump, left upper limb: Secondary | ICD-10-CM | POA: Diagnosis not present

## 2022-03-06 DIAGNOSIS — J22 Unspecified acute lower respiratory infection: Secondary | ICD-10-CM | POA: Diagnosis not present

## 2022-08-24 DIAGNOSIS — E7800 Pure hypercholesterolemia, unspecified: Secondary | ICD-10-CM | POA: Diagnosis not present

## 2022-08-24 DIAGNOSIS — L942 Calcinosis cutis: Secondary | ICD-10-CM | POA: Diagnosis not present

## 2022-08-24 DIAGNOSIS — Z6836 Body mass index (BMI) 36.0-36.9, adult: Secondary | ICD-10-CM | POA: Diagnosis not present

## 2022-08-24 DIAGNOSIS — Z23 Encounter for immunization: Secondary | ICD-10-CM | POA: Diagnosis not present

## 2022-08-24 DIAGNOSIS — Z125 Encounter for screening for malignant neoplasm of prostate: Secondary | ICD-10-CM | POA: Diagnosis not present

## 2022-08-24 DIAGNOSIS — Z0000 Encounter for general adult medical examination without abnormal findings: Secondary | ICD-10-CM | POA: Diagnosis not present

## 2022-08-24 DIAGNOSIS — N529 Male erectile dysfunction, unspecified: Secondary | ICD-10-CM | POA: Diagnosis not present

## 2022-11-09 ENCOUNTER — Other Ambulatory Visit: Payer: Self-pay | Admitting: Gastroenterology

## 2022-11-09 DIAGNOSIS — K6389 Other specified diseases of intestine: Secondary | ICD-10-CM

## 2022-11-09 DIAGNOSIS — D490 Neoplasm of unspecified behavior of digestive system: Secondary | ICD-10-CM | POA: Diagnosis not present

## 2022-11-09 DIAGNOSIS — Z1211 Encounter for screening for malignant neoplasm of colon: Secondary | ICD-10-CM | POA: Diagnosis not present

## 2022-11-09 DIAGNOSIS — D122 Benign neoplasm of ascending colon: Secondary | ICD-10-CM | POA: Diagnosis not present

## 2022-11-09 DIAGNOSIS — D125 Benign neoplasm of sigmoid colon: Secondary | ICD-10-CM | POA: Diagnosis not present

## 2022-11-09 DIAGNOSIS — K648 Other hemorrhoids: Secondary | ICD-10-CM | POA: Diagnosis not present

## 2022-11-12 ENCOUNTER — Encounter (HOSPITAL_BASED_OUTPATIENT_CLINIC_OR_DEPARTMENT_OTHER): Payer: Self-pay

## 2022-11-12 ENCOUNTER — Ambulatory Visit (HOSPITAL_BASED_OUTPATIENT_CLINIC_OR_DEPARTMENT_OTHER)
Admission: RE | Admit: 2022-11-12 | Discharge: 2022-11-12 | Disposition: A | Discharge status: Home / Self Care | Payer: BC Managed Care – PPO | Source: Ambulatory Visit | Attending: Gastroenterology | Admitting: Gastroenterology

## 2022-11-12 DIAGNOSIS — K6389 Other specified diseases of intestine: Secondary | ICD-10-CM | POA: Diagnosis not present

## 2022-11-12 DIAGNOSIS — N200 Calculus of kidney: Secondary | ICD-10-CM | POA: Diagnosis not present

## 2022-11-12 DIAGNOSIS — K5900 Constipation, unspecified: Secondary | ICD-10-CM | POA: Diagnosis not present

## 2022-11-12 DIAGNOSIS — I700 Atherosclerosis of aorta: Secondary | ICD-10-CM | POA: Diagnosis not present

## 2022-11-12 MED ORDER — IOHEXOL 300 MG/ML  SOLN
100.0000 mL | Freq: Once | INTRAMUSCULAR | Status: AC | PRN
  Administered 2022-11-12: 100 mL via INTRAVENOUS

## 2022-11-26 ENCOUNTER — Ambulatory Visit: Payer: Self-pay | Admitting: General Surgery

## 2022-11-26 DIAGNOSIS — D122 Benign neoplasm of ascending colon: Secondary | ICD-10-CM | POA: Diagnosis not present

## 2022-11-26 NOTE — H&P (View-Only) (Signed)
REFERRING PHYSICIAN:  Karki, Arya, MD   PROVIDER:  Romano Stigger Mathew Stelios Kirby, MD   MRN: [id number] DOB: 04/07/1966 DATE OF ENCOUNTER: 11/26/2022   Subjective    Chief Complaint: New Consultation (mass on ileocecal valve)       History of Present Illness: Mathew Sexton is a 56 y.o. male who is seen today as an office consultation at the request of Dr. Karki for evaluation of New Consultation (mass on ileocecal valve) .     56-year-old male who underwent screening colonoscopy.  A polypoid fungating mass was found at the ileocecal valve.  This was biopsied.  Another 6 mm polyp in the ascending colon and a 25 mm polyp in the sigmoid colon were also resected and retrieved.  Pathology showed fragments of tubular adenoma at the ileocecal valve.  The other 2 polyps were tubular adenoma.  CT scan of the abdomen and pelvis showed nothing concerning for metastatic disease.  He does have a small umbilical hernia ~2.5cm in size.       Review of Systems: A complete review of systems was obtained from the patient.  I have reviewed this information and discussed as appropriate with the patient.  See HPI as well for other ROS.     Medical History: Past Medical History  History reviewed. No pertinent past medical history.      Problem List  There is no problem list on file for this patient.      Past Surgical History       Past Surgical History:  Procedure Laterality Date   Cyst Removal Finger        2023?        Allergies  No Known Allergies     Medications Ordered Prior to Encounter        Current Outpatient Medications on File Prior to Visit  Medication Sig Dispense Refill   sildenafiL (VIAGRA) 100 MG tablet TAKE 1 TABLET BY MOUTH ONCE DAILY AS NEEDED ONE HOUR PRIOR TO SEXUAL INTERCOURSE        No current facility-administered medications on file prior to visit.        Family History       Family History  Problem Relation Age of Onset   Lung cancer Mother     Stroke Father           Tobacco Use History  Social History       Tobacco Use  Smoking Status Never  Smokeless Tobacco Never        Social History  Social History        Socioeconomic History   Marital status: Married  Tobacco Use   Smoking status: Never   Smokeless tobacco: Never  Vaping Use   Vaping status: Never Used  Substance and Sexual Activity   Alcohol use: Not Currently   Drug use: Not Currently        Objective:         Vitals:    11/26/22 0928  BP: (!) 157/84  Pulse: 67  Temp: 36.7 C (98 F)  SpO2: 98%  Weight: (!) 126.4 kg (278 lb 9.6 oz)  Height: 185.4 cm (6' 1")  PainSc: 0-No pain      Exam Gen: NAD CV: RRR Lungs: CTA Abd: soft, reducible umbilical hernia       Labs, Imaging and Diagnostic Testing: CT images reviewed.  Patient appears to have an umbilical hernia which is approximately 2.5 cm in diameter.  There does appear to be a   mass visualized on CT scan at the ileocecal valve.   Assessment and Plan:  Diagnoses and all orders for this visit:   Adenomatous polyp of ascending colon -     polyethylene glycol (MIRALAX) powder; Take 233.75 g by mouth once for 1 dose Take according to your procedure prep instructions. -     bisacodyL (DULCOLAX) 5 mg EC tablet; Take 4 tablets (20 mg total) by mouth once daily as needed for Constipation for up to 1 dose -     metroNIDAZOLE (FLAGYL) 500 MG tablet; Take 2 tablets (1,000 mg total) by mouth 3 (three) times daily for 3 doses Take according to your procedure colon prep instructions -     neomycin 500 mg tablet; Take 2 tablets (1,000 mg total) by mouth 3 (three) times daily for 3 doses Take according to your procedure colon prep instructions       56-year-old male with endoscopically unresectable ileocecal polyp.  I have recommended robotic assisted right colectomy.  We discussed the risk of an underlying cancer and the need to perform a cancer resection.  We discussed the typical time in the hospital and  recovery time afterwards.  All questions were answered.   The surgery and anatomy were described to the patient as well as the risks of surgery and the possible complications.  These include: Bleeding, deep abdominal infections and possible wound complications such as hernia and infection, damage to adjacent structures, leak of surgical connections, which can lead to other surgeries and possibly an ostomy, possible need for other procedures, such as abscess drains in radiology, possible prolonged hospital stay, possible diarrhea from removal of part of the colon, possible constipation from narcotics, possible bowel, bladder or sexual dysfunction if having rectal surgery, prolonged fatigue/weakness or appetite loss, possible early recurrence of of disease, possible complications of their medical problems such as heart disease or arrhythmias or lung problems, death (less than 1%). I believe the patient understands and wishes to proceed with the surgery.    Mathew Vanallen C Jacorian Golaszewski, MD Colon and Rectal Surgery [facility]    

## 2022-11-26 NOTE — H&P (Signed)
REFERRING PHYSICIAN:  Kerin Salen, MD   PROVIDER:  Elenora Gamma, MD   MRN: [id number] DOB: May 30, 1966 DATE OF ENCOUNTER: 11/26/2022   Subjective    Chief Complaint: New Consultation (mass on ileocecal valve)       History of Present Illness: Mathew Sexton is a 57 y.o. male who is seen today as an office consultation at the request of Dr. Marca Ancona for evaluation of New Consultation (mass on ileocecal valve) .     57 year old male who underwent screening colonoscopy.  A polypoid fungating mass was found at the ileocecal valve.  This was biopsied.  Another 6 mm polyp in the ascending colon and a 25 mm polyp in the sigmoid colon were also resected and retrieved.  Pathology showed fragments of tubular adenoma at the ileocecal valve.  The other 2 polyps were tubular adenoma.  CT scan of the abdomen and pelvis showed nothing concerning for metastatic disease.  He does have a small umbilical hernia ~2.5cm in size.       Review of Systems: A complete review of systems was obtained from the patient.  I have reviewed this information and discussed as appropriate with the patient.  See HPI as well for other ROS.     Medical History: Past Medical History  History reviewed. No pertinent past medical history.      Problem List  There is no problem list on file for this patient.      Past Surgical History       Past Surgical History:  Procedure Laterality Date   Cyst Removal Finger        2023?        Allergies  No Known Allergies     Medications Ordered Prior to Encounter        Current Outpatient Medications on File Prior to Visit  Medication Sig Dispense Refill   sildenafiL (VIAGRA) 100 MG tablet TAKE 1 TABLET BY MOUTH ONCE DAILY AS NEEDED ONE HOUR PRIOR TO SEXUAL INTERCOURSE        No current facility-administered medications on file prior to visit.        Family History       Family History  Problem Relation Age of Onset   Lung cancer Mother     Stroke Father           Tobacco Use History  Social History       Tobacco Use  Smoking Status Never  Smokeless Tobacco Never        Social History  Social History        Socioeconomic History   Marital status: Married  Tobacco Use   Smoking status: Never   Smokeless tobacco: Never  Vaping Use   Vaping status: Never Used  Substance and Sexual Activity   Alcohol use: Not Currently   Drug use: Not Currently        Objective:         Vitals:    11/26/22 0928  BP: (!) 157/84  Pulse: 67  Temp: 36.7 C (98 F)  SpO2: 98%  Weight: (!) 126.4 kg (278 lb 9.6 oz)  Height: 185.4 cm (6\' 1" )  PainSc: 0-No pain      Exam Gen: NAD CV: RRR Lungs: CTA Abd: soft, reducible umbilical hernia       Labs, Imaging and Diagnostic Testing: CT images reviewed.  Patient appears to have an umbilical hernia which is approximately 2.5 cm in diameter.  There does appear to be a  mass visualized on CT scan at the ileocecal valve.   Assessment and Plan:  Diagnoses and all orders for this visit:   Adenomatous polyp of ascending colon -     polyethylene glycol (MIRALAX) powder; Take 233.75 g by mouth once for 1 dose Take according to your procedure prep instructions. -     bisacodyL (DULCOLAX) 5 mg EC tablet; Take 4 tablets (20 mg total) by mouth once daily as needed for Constipation for up to 1 dose -     metroNIDAZOLE (FLAGYL) 500 MG tablet; Take 2 tablets (1,000 mg total) by mouth 3 (three) times daily for 3 doses Take according to your procedure colon prep instructions -     neomycin 500 mg tablet; Take 2 tablets (1,000 mg total) by mouth 3 (three) times daily for 3 doses Take according to your procedure colon prep instructions       57 year old male with endoscopically unresectable ileocecal polyp.  I have recommended robotic assisted right colectomy.  We discussed the risk of an underlying cancer and the need to perform a cancer resection.  We discussed the typical time in the hospital and  recovery time afterwards.  All questions were answered.   The surgery and anatomy were described to the patient as well as the risks of surgery and the possible complications.  These include: Bleeding, deep abdominal infections and possible wound complications such as hernia and infection, damage to adjacent structures, leak of surgical connections, which can lead to other surgeries and possibly an ostomy, possible need for other procedures, such as abscess drains in radiology, possible prolonged hospital stay, possible diarrhea from removal of part of the colon, possible constipation from narcotics, possible bowel, bladder or sexual dysfunction if having rectal surgery, prolonged fatigue/weakness or appetite loss, possible early recurrence of of disease, possible complications of their medical problems such as heart disease or arrhythmias or lung problems, death (less than 1%). I believe the patient understands and wishes to proceed with the surgery.    Vanita Panda, MD Colon and Rectal Surgery [facility]

## 2022-12-06 NOTE — Progress Notes (Addendum)
Anesthesia Review:  PCP: Tally Joe Cardiologist : mnone Chest x-ray : EKG : Echo : Stress test: Cardiac Cath :  Activity level: can do a fliught of stairs without difficulty Sleep Study/ CPAP : none Fasting Blood Sugar :      / Checks Blood Sugar -- times a day:   Blood Thinner/ Instructions /Last Dose: ASA / Instructions/ Last Dose :   Pt reported at time of preop appt being diagnosed with chlamdyia on 55/13/2024.  Doxycycline prescribed unitl 12/17/22 per pt  Called and repported to triage at [facility] spoke with laura.

## 2022-12-07 NOTE — Patient Instructions (Signed)
SURGICAL WAITING ROOM VISITATION  Patients having surgery or a procedure may have no more than 2 support people in the waiting area - these visitors may rotate.    Children under the age of 34 must have an adult with them who is not the patient.  Due to an increase in RSV and influenza rates and associated hospitalizations, children ages 79 and under may not visit patients in [facility] hospitals.  If the patient needs to stay at the hospital during part of their recovery, the visitor guidelines for inpatient rooms apply. Pre-op nurse will coordinate an appropriate time for 1 support person to accompany patient in pre-op.  This support person may not rotate.    Please refer to the [facility] website for the visitor guidelines for Inpatients (after your surgery is over and you are in a regular room).       Your procedure is scheduled on:  12/20/22    Report to [facility] Main Entrance    Report to admitting at   [phone]   Call this number if you have problems the morning of surgery [phone]    Clear liquid diet on day of bowel prep.    After Midnight you may have the following liquids until _ 0530_____ AM  DAY OF SURGERY  Water Non-Citrus Juices (without pulp, NO RED-Apple, White grape, White cranberry) Black Coffee (NO MILK/CREAM OR CREAMERS, sugar ok)  Clear Tea (NO MILK/CREAM OR CREAMERS, sugar ok) regular and decaf                             Plain Jell-O (NO RED)                                           Fruit ices (not with fruit pulp, NO RED)                                     Popsicles (NO RED)                                                               Sports drinks like Gatorade (NO RED)              Drink 2 Ensure/G2 drinks AT 10:00 PM the night before surgery.        The day of surgery:  Drink ONE (1) Pre-Surgery Clear Ensure or G2 at   0530AM ( have completed by )  the morning of surgery. Drink in one sitting. Do not sip.  This drink was given to  you during your hospital  pre-op appointment visit. Nothing else to drink after completing the  Pre-Surgery Clear Ensure or G2.          If you have questions, please contact your surgeon's office.   FOLLOW BOWEL PREP AND ANY ADDITIONAL PRE OP INSTRUCTIONS YOU RECEIVED FROM YOUR SURGEON'S OFFICE!!!     Oral Hygiene is also important to reduce your risk of infection.  Remember - BRUSH YOUR TEETH THE MORNING OF SURGERY WITH YOUR REGULAR TOOTHPASTE  DENTURES WILL BE REMOVED PRIOR TO SURGERY PLEASE DO NOT APPLY "Poly grip" OR ADHESIVES!!!   Do NOT smoke after Midnight   Take these medicines the morning of surgery with A SIP OF WATER:  none   DO NOT TAKE ANY ORAL DIABETIC MEDICATIONS DAY OF YOUR SURGERY  Bring CPAP mask and tubing day of surgery.                              You may not have any metal on your body including hair pins, jewelry, and body piercing             Do not wear make-up, lotions, powders, perfumes/cologne, or deodorant  Do not wear nail polish including gel and S&S, artificial/acrylic nails, or any other type of covering on natural nails including finger and toenails. If you have artificial nails, gel coating, etc. that needs to be removed by a nail salon please have this removed prior to surgery or surgery may need to be canceled/ delayed if the surgeon/ anesthesia feels like they are unable to be safely monitored.   Do not shave  48 hours prior to surgery.               Men may shave face and neck.   Do not bring valuables to the hospital. [facility] IS NOT             RESPONSIBLE   FOR VALUABLES.   Contacts, glasses, dentures or bridgework may not be worn into surgery.   Bring small overnight bag day of surgery.   DO NOT BRING YOUR HOME MEDICATIONS TO THE HOSPITAL. PHARMACY WILL DISPENSE MEDICATIONS LISTED ON YOUR MEDICATION LIST TO YOU DURING YOUR ADMISSION IN THE HOSPITAL!    Patients discharged on the day of  surgery will not be allowed to drive home.  Someone NEEDS to stay with you for the first 24 hours after anesthesia.   Special Instructions: Bring a copy of your healthcare power of attorney and living will documents the day of surgery if you haven't scanned them before.              Please read over the following fact sheets you were given: IF YOU HAVE QUESTIONS ABOUT YOUR PRE-OP INSTRUCTIONS PLEASE CALL [phone]   If you received a COVID test during your pre-op visit  it is requested that you wear a mask when out in public, stay away from anyone that may not be feeling well and notify your surgeon if you develop symptoms. If you test positive for Covid or have been in contact with anyone that has tested positive in the last 10 days please notify you surgeon.    [place] - Preparing for Surgery Before surgery, you can play an important role.  Because skin is not sterile, your skin needs to be as free of germs as possible.  You can reduce the number of germs on your skin by washing with CHG (chlorahexidine gluconate) soap before surgery.  CHG is an antiseptic cleaner which kills germs and bonds with the skin to continue killing germs even after washing. Please DO NOT use if you have an allergy to CHG or antibacterial soaps.  If your skin becomes reddened/irritated stop using the CHG and inform your nurse when you arrive at Short Stay. Do not shave (including legs and underarms) for  at least 48 hours prior to the first CHG shower.  You may shave your face/neck. Please follow these instructions carefully:  1.  Shower with CHG Soap the night before surgery and the  morning of Surgery.  2.  If you choose to wash your hair, wash your hair first as usual with your  normal  shampoo.  3.  After you shampoo, rinse your hair and body thoroughly to remove the  shampoo.                           4.  Use CHG as you would any other liquid soap.  You can apply chg directly  to the skin and wash                        Gently with a scrungie or clean washcloth.  5.  Apply the CHG Soap to your body ONLY FROM THE NECK DOWN.   Do not use on face/ open                           Wound or open sores. Avoid contact with eyes, ears mouth and genitals (private parts).                       Wash face,  Genitals (private parts) with your normal soap.             6.  Wash thoroughly, paying special attention to the area where your surgery  will be performed.  7.  Thoroughly rinse your body with warm water from the neck down.  8.  DO NOT shower/wash with your normal soap after using and rinsing off  the CHG Soap.                9.  Pat yourself dry with a clean towel.            10.  Wear clean pajamas.            11.  Place clean sheets on your bed the night of your first shower and do not  sleep with pets. Day of Surgery : Do not apply any lotions/deodorants the morning of surgery.  Please wear clean clothes to the hospital/surgery center.  FAILURE TO FOLLOW THESE INSTRUCTIONS MAY RESULT IN THE CANCELLATION OF YOUR SURGERY PATIENT SIGNATURE_________________________________  NURSE SIGNATURE__________________________________  ________________________________________________________________________

## 2022-12-10 ENCOUNTER — Encounter (HOSPITAL_COMMUNITY)
Admission: RE | Admit: 2022-12-10 | Discharge: 2022-12-10 | Disposition: A | Discharge status: Home / Self Care | Payer: BC Managed Care – PPO | Source: Ambulatory Visit | Attending: General Surgery | Admitting: General Surgery

## 2022-12-10 ENCOUNTER — Other Ambulatory Visit: Payer: Self-pay

## 2022-12-10 ENCOUNTER — Encounter (HOSPITAL_COMMUNITY): Payer: Self-pay

## 2022-12-10 DIAGNOSIS — Z01818 Encounter for other preprocedural examination: Secondary | ICD-10-CM

## 2022-12-10 DIAGNOSIS — Z202 Contact with and (suspected) exposure to infections with a predominantly sexual mode of transmission: Secondary | ICD-10-CM | POA: Diagnosis not present

## 2022-12-10 DIAGNOSIS — Z01812 Encounter for preprocedural laboratory examination: Secondary | ICD-10-CM | POA: Diagnosis not present

## 2022-12-10 HISTORY — DX: Inflammatory liver disease, unspecified: K75.9

## 2022-12-10 HISTORY — DX: Other specified postprocedural states: Z98.890

## 2022-12-10 HISTORY — DX: Personal history of urinary calculi: Z87.442

## 2022-12-10 LAB — COMPREHENSIVE METABOLIC PANEL
ALT: 14 U/L (ref 0–44)
AST: 14 U/L — ABNORMAL LOW (ref 15–41)
Albumin: 3.9 g/dL (ref 3.5–5.0)
Alkaline Phosphatase: 66 U/L (ref 38–126)
Anion gap: 7 (ref 5–15)
BUN: 12 mg/dL (ref 6–20)
CO2: 23 mmol/L (ref 22–32)
Calcium: 9 mg/dL (ref 8.9–10.3)
Chloride: 108 mmol/L (ref 98–111)
Creatinine, Ser: 1.05 mg/dL (ref 0.61–1.24)
GFR, Estimated: >60 mL/min (ref >60)
Glucose, Bld: 105 mg/dL — ABNORMAL HIGH (ref 70–99)
Potassium: 4 mmol/L (ref 3.5–5.1)
Sodium: 138 mmol/L (ref 135–145)
Total Bilirubin: 0.4 mg/dL (ref 0.3–1.2)
Total Protein: 7.5 g/dL (ref 6.5–8.1)

## 2022-12-10 LAB — CBC
HCT: 47.4 % (ref 39.0–52.0)
Hemoglobin: 15.6 g/dL (ref 13.0–17.0)
MCH: 31.6 pg (ref 26.0–34.0)
MCHC: 32.9 g/dL (ref 30.0–36.0)
MCV: 96.1 fL (ref 80.0–100.0)
Platelets: 259 10*3/uL (ref 150–400)
RBC: 4.93 MIL/uL (ref 4.22–5.81)
RDW: 13.2 % (ref 11.5–15.5)
WBC: 8.7 10*3/uL (ref 4.0–10.5)
nRBC: 0 % (ref 0.0–0.2)

## 2022-12-10 LAB — TYPE AND SCREEN: Antibody Screen: NEGATIVE

## 2022-12-19 NOTE — Progress Notes (Signed)
Patient aware to arrive at 0515 for surgery on 5/23

## 2022-12-19 NOTE — Anesthesia Preprocedure Evaluation (Signed)
Anesthesia Evaluation  Patient identified by MRN, date of birth, ID band Patient awake    Reviewed: Allergy & Precautions, H&P , NPO status , Patient's Chart, lab work & pertinent test results  History of Anesthesia Complications (+) PONV and history of anesthetic complications  Airway Mallampati: II  TM Distance: >3 FB Neck ROM: Full    Dental no notable dental hx. (+) Teeth Intact, Dental Advisory Given   Pulmonary neg pulmonary ROS, former smoker   Pulmonary exam normal breath sounds clear to auscultation       Cardiovascular Exercise Tolerance: Good negative cardio ROS  Rhythm:Regular Rate:Normal     Neuro/Psych negative neurological ROS  negative psych ROS   GI/Hepatic negative GI ROS, Neg liver ROS,,,  Endo/Other    Morbid obesity  Renal/GU negative Renal ROS  negative genitourinary   Musculoskeletal   Abdominal   Peds  Hematology negative hematology ROS (+)   Anesthesia Other Findings   Reproductive/Obstetrics negative OB ROS                             Anesthesia Physical Anesthesia Plan  ASA: 2  Anesthesia Plan: General   Post-op Pain Management: Tylenol PO (pre-op)*   Induction: Intravenous  PONV Risk Score and Plan: 4 or greater and Ondansetron, Dexamethasone, Midazolam and Scopolamine patch - Pre-op  Airway Management Planned: Oral ETT  Additional Equipment:   Intra-op Plan:   Post-operative Plan: Extubation in OR  Informed Consent: I have reviewed the patients History and Physical, chart, labs and discussed the procedure including the risks, benefits and alternatives for the proposed anesthesia with the patient or authorized representative who has indicated his/her understanding and acceptance.     Dental advisory given  Plan Discussed with: CRNA  Anesthesia Plan Comments:        Anesthesia Quick Evaluation

## 2022-12-20 ENCOUNTER — Inpatient Hospital Stay (HOSPITAL_COMMUNITY): Payer: BC Managed Care – PPO | Admitting: Anesthesiology

## 2022-12-20 ENCOUNTER — Encounter (HOSPITAL_COMMUNITY): Payer: Self-pay | Admitting: General Surgery

## 2022-12-20 ENCOUNTER — Other Ambulatory Visit: Payer: Self-pay

## 2022-12-20 ENCOUNTER — Inpatient Hospital Stay (HOSPITAL_COMMUNITY)
Admission: RE | Admit: 2022-12-20 | Discharge: 2022-12-22 | DRG: 331 | Disposition: A | Discharge status: Home / Self Care | Payer: BC Managed Care – PPO | Source: Ambulatory Visit | Attending: General Surgery | Admitting: General Surgery

## 2022-12-20 ENCOUNTER — Encounter (HOSPITAL_COMMUNITY)
Admission: RE | Disposition: A | Discharge status: Home / Self Care | Payer: Self-pay | Source: Ambulatory Visit | Attending: General Surgery

## 2022-12-20 DIAGNOSIS — K635 Polyp of colon: Secondary | ICD-10-CM | POA: Diagnosis not present

## 2022-12-20 DIAGNOSIS — Z01818 Encounter for other preprocedural examination: Secondary | ICD-10-CM

## 2022-12-20 DIAGNOSIS — K429 Umbilical hernia without obstruction or gangrene: Secondary | ICD-10-CM | POA: Diagnosis present

## 2022-12-20 DIAGNOSIS — Z823 Family history of stroke: Secondary | ICD-10-CM | POA: Diagnosis not present

## 2022-12-20 DIAGNOSIS — Z801 Family history of malignant neoplasm of trachea, bronchus and lung: Secondary | ICD-10-CM

## 2022-12-20 DIAGNOSIS — D122 Benign neoplasm of ascending colon: Secondary | ICD-10-CM | POA: Diagnosis not present

## 2022-12-20 DIAGNOSIS — K388 Other specified diseases of appendix: Secondary | ICD-10-CM | POA: Diagnosis not present

## 2022-12-20 HISTORY — PX: UMBILICAL HERNIA REPAIR: SHX196

## 2022-12-20 LAB — TYPE AND SCREEN: ABO/RH(D): A POS

## 2022-12-20 LAB — ABO/RH: ABO/RH(D): A POS

## 2022-12-20 SURGERY — COLECTOMY, PARTIAL, ROBOT-ASSISTED, LAPAROSCOPIC
Anesthesia: General | Site: Abdomen

## 2022-12-20 MED ORDER — INDOCYANINE GREEN 25 MG IV SOLR
INTRAVENOUS | Status: AC
  Filled 2022-12-20: qty 10

## 2022-12-20 MED ORDER — PROPOFOL 10 MG/ML IV BOLUS
INTRAVENOUS | Status: DC | PRN
  Administered 2022-12-20: 200 mg via INTRAVENOUS

## 2022-12-20 MED ORDER — TRAMADOL HCL 50 MG PO TABS
50.0000 mg | ORAL_TABLET | Freq: Four times a day (QID) | ORAL | Status: DC | PRN
  Administered 2022-12-20 – 2022-12-22 (×5): 100 mg via ORAL
  Administered 2022-12-22: 50 mg via ORAL
  Filled 2022-12-20 (×2): qty 2
  Filled 2022-12-20: qty 1
  Filled 2022-12-20 (×3): qty 2

## 2022-12-20 MED ORDER — POLYETHYLENE GLYCOL 3350 17 GM/SCOOP PO POWD: 1.0000 | Freq: Once | ORAL | Status: DC

## 2022-12-20 MED ORDER — ALVIMOPAN 12 MG PO CAPS
12.0000 mg | ORAL_CAPSULE | ORAL | Status: AC
  Administered 2022-12-20: 12 mg via ORAL
  Filled 2022-12-20: qty 1

## 2022-12-20 MED ORDER — SACCHAROMYCES BOULARDII 250 MG PO CAPS
250.0000 mg | ORAL_CAPSULE | Freq: Two times a day (BID) | ORAL | Status: DC
  Administered 2022-12-20 – 2022-12-21 (×3): 250 mg via ORAL
  Filled 2022-12-20 (×4): qty 1

## 2022-12-20 MED ORDER — GABAPENTIN 300 MG PO CAPS
300.0000 mg | ORAL_CAPSULE | ORAL | Status: AC
  Administered 2022-12-20: 300 mg via ORAL
  Filled 2022-12-20: qty 1

## 2022-12-20 MED ORDER — BISACODYL 5 MG PO TBEC: 20.0000 mg | DELAYED_RELEASE_TABLET | Freq: Once | ORAL | Status: DC

## 2022-12-20 MED ORDER — INDOCYANINE GREEN 25 MG IV SOLR
INTRAVENOUS | Status: DC | PRN
  Administered 2022-12-20: 2.5 mg via INTRAVENOUS

## 2022-12-20 MED ORDER — CHLORHEXIDINE GLUCONATE 0.12 % MT SOLN
15.0000 mL | Freq: Once | OROMUCOSAL | Status: AC
  Administered 2022-12-20: 15 mL via OROMUCOSAL

## 2022-12-20 MED ORDER — DIPHENHYDRAMINE HCL 12.5 MG/5ML PO ELIX: 12.5000 mg | ORAL_SOLUTION | Freq: Four times a day (QID) | ORAL | Status: DC | PRN

## 2022-12-20 MED ORDER — SODIUM CHLORIDE 0.9 % IV SOLN
2.0000 g | Freq: Two times a day (BID) | INTRAVENOUS | Status: AC
  Administered 2022-12-20: 2 g via INTRAVENOUS
  Filled 2022-12-20: qty 2

## 2022-12-20 MED ORDER — HYDROMORPHONE HCL 1 MG/ML IJ SOLN
INTRAMUSCULAR | Status: AC
  Administered 2022-12-20: 0.25 mg via INTRAVENOUS
  Filled 2022-12-20: qty 1

## 2022-12-20 MED ORDER — KCL IN DEXTROSE-NACL 20-5-0.45 MEQ/L-%-% IV SOLN
INTRAVENOUS | Status: DC
  Filled 2022-12-20 (×2): qty 1000

## 2022-12-20 MED ORDER — ENSURE SURGERY PO LIQD
237.0000 mL | Freq: Two times a day (BID) | ORAL | Status: DC
  Administered 2022-12-20: 237 mL via ORAL

## 2022-12-20 MED ORDER — FENTANYL CITRATE (PF) 100 MCG/2ML IJ SOLN
INTRAMUSCULAR | Status: DC | PRN
  Administered 2022-12-20: 50 ug via INTRAVENOUS
  Administered 2022-12-20: 100 ug via INTRAVENOUS
  Administered 2022-12-20: 50 ug via INTRAVENOUS

## 2022-12-20 MED ORDER — LACTATED RINGERS IV SOLN: INTRAVENOUS | Status: DC

## 2022-12-20 MED ORDER — 0.9 % SODIUM CHLORIDE (POUR BTL) OPTIME
TOPICAL | Status: DC | PRN
  Administered 2022-12-20: 2000 mL

## 2022-12-20 MED ORDER — BUPIVACAINE LIPOSOME 1.3 % IJ SUSP: 20.0000 mL | Freq: Once | INTRAMUSCULAR | Status: DC

## 2022-12-20 MED ORDER — ALVIMOPAN 12 MG PO CAPS
12.0000 mg | ORAL_CAPSULE | Freq: Two times a day (BID) | ORAL | Status: DC
  Administered 2022-12-21: 12 mg via ORAL
  Filled 2022-12-20: qty 1

## 2022-12-20 MED ORDER — KETAMINE HCL 50 MG/5ML IJ SOSY
PREFILLED_SYRINGE | INTRAMUSCULAR | Status: AC
  Filled 2022-12-20: qty 5

## 2022-12-20 MED ORDER — PHENYLEPHRINE 80 MCG/ML (10ML) SYRINGE FOR IV PUSH (FOR BLOOD PRESSURE SUPPORT)
PREFILLED_SYRINGE | INTRAVENOUS | Status: DC | PRN
  Administered 2022-12-20: 160 ug via INTRAVENOUS
  Administered 2022-12-20: 80 ug via INTRAVENOUS
  Administered 2022-12-20: 160 ug via INTRAVENOUS

## 2022-12-20 MED ORDER — BUPIVACAINE HCL 0.25 % IJ SOLN
INTRAMUSCULAR | Status: AC
  Filled 2022-12-20: qty 1

## 2022-12-20 MED ORDER — LIDOCAINE HCL (PF) 2 % IJ SOLN
INTRAMUSCULAR | Status: AC
  Filled 2022-12-20: qty 25

## 2022-12-20 MED ORDER — PROPOFOL 10 MG/ML IV BOLUS
INTRAVENOUS | Status: AC
  Filled 2022-12-20: qty 20

## 2022-12-20 MED ORDER — ENOXAPARIN SODIUM 40 MG/0.4ML IJ SOSY
40.0000 mg | PREFILLED_SYRINGE | INTRAMUSCULAR | Status: DC
  Administered 2022-12-21: 40 mg via SUBCUTANEOUS
  Filled 2022-12-20: qty 0.4

## 2022-12-20 MED ORDER — BUPIVACAINE LIPOSOME 1.3 % IJ SUSP
INTRAMUSCULAR | Status: AC
  Filled 2022-12-20: qty 20

## 2022-12-20 MED ORDER — DIPHENHYDRAMINE HCL 50 MG/ML IJ SOLN: 12.5000 mg | Freq: Four times a day (QID) | INTRAMUSCULAR | Status: DC | PRN

## 2022-12-20 MED ORDER — ORAL CARE MOUTH RINSE: 15.0000 mL | Freq: Once | OROMUCOSAL | Status: AC

## 2022-12-20 MED ORDER — ALUM & MAG HYDROXIDE-SIMETH 200-200-20 MG/5ML PO SUSP: 30.0000 mL | Freq: Four times a day (QID) | ORAL | Status: DC | PRN

## 2022-12-20 MED ORDER — GABAPENTIN 300 MG PO CAPS
300.0000 mg | ORAL_CAPSULE | Freq: Two times a day (BID) | ORAL | Status: DC
  Administered 2022-12-20 – 2022-12-22 (×4): 300 mg via ORAL
  Filled 2022-12-20: qty 3
  Filled 2022-12-20: qty 1
  Filled 2022-12-20 (×2): qty 3

## 2022-12-20 MED ORDER — ENSURE PRE-SURGERY PO LIQD: 296.0000 mL | Freq: Once | ORAL | Status: DC

## 2022-12-20 MED ORDER — SODIUM CHLORIDE 0.9 % IV SOLN
2.0000 g | INTRAVENOUS | Status: AC
  Administered 2022-12-20: 2 g via INTRAVENOUS
  Filled 2022-12-20: qty 2

## 2022-12-20 MED ORDER — ROCURONIUM BROMIDE 10 MG/ML (PF) SYRINGE
PREFILLED_SYRINGE | INTRAVENOUS | Status: DC | PRN
  Administered 2022-12-20: 100 mg via INTRAVENOUS
  Administered 2022-12-20 (×2): 20 mg via INTRAVENOUS

## 2022-12-20 MED ORDER — MIDAZOLAM HCL 5 MG/5ML IJ SOLN
INTRAMUSCULAR | Status: DC | PRN
  Administered 2022-12-20: 2 mg via INTRAVENOUS

## 2022-12-20 MED ORDER — ONDANSETRON HCL 4 MG/2ML IJ SOLN
INTRAMUSCULAR | Status: DC | PRN
  Administered 2022-12-20: 4 mg via INTRAVENOUS

## 2022-12-20 MED ORDER — ONDANSETRON HCL 4 MG PO TABS: 4.0000 mg | ORAL_TABLET | Freq: Four times a day (QID) | ORAL | Status: DC | PRN

## 2022-12-20 MED ORDER — ENSURE PRE-SURGERY PO LIQD: 592.0000 mL | Freq: Once | ORAL | Status: DC

## 2022-12-20 MED ORDER — ONDANSETRON HCL 4 MG/2ML IJ SOLN
4.0000 mg | Freq: Four times a day (QID) | INTRAMUSCULAR | Status: DC | PRN
  Administered 2022-12-20 (×2): 4 mg via INTRAVENOUS
  Filled 2022-12-20 (×2): qty 2

## 2022-12-20 MED ORDER — LACTATED RINGERS IV SOLN: INTRAVENOUS | Status: DC | PRN

## 2022-12-20 MED ORDER — SIMETHICONE 80 MG PO CHEW
40.0000 mg | CHEWABLE_TABLET | Freq: Four times a day (QID) | ORAL | Status: DC | PRN
  Administered 2022-12-22: 40 mg via ORAL
  Filled 2022-12-20: qty 1

## 2022-12-20 MED ORDER — LACTATED RINGERS IR SOLN
Status: DC | PRN
  Administered 2022-12-20: 1000 mL

## 2022-12-20 MED ORDER — ONDANSETRON HCL 4 MG/2ML IJ SOLN
INTRAMUSCULAR | Status: AC
  Filled 2022-12-20: qty 2

## 2022-12-20 MED ORDER — LIDOCAINE 2% (20 MG/ML) 5 ML SYRINGE
INTRAMUSCULAR | Status: DC | PRN
  Administered 2022-12-20: 1.5 mg/kg/h via INTRAVENOUS
  Administered 2022-12-20: 80 mg via INTRAVENOUS

## 2022-12-20 MED ORDER — SUGAMMADEX SODIUM 200 MG/2ML IV SOLN
INTRAVENOUS | Status: DC | PRN
  Administered 2022-12-20: 300 mg via INTRAVENOUS

## 2022-12-20 MED ORDER — MIDAZOLAM HCL 2 MG/2ML IJ SOLN
INTRAMUSCULAR | Status: AC
  Filled 2022-12-20: qty 2

## 2022-12-20 MED ORDER — KETAMINE HCL 10 MG/ML IJ SOLN
INTRAMUSCULAR | Status: DC | PRN
  Administered 2022-12-20: 50 mg via INTRAVENOUS

## 2022-12-20 MED ORDER — FENTANYL CITRATE (PF) 250 MCG/5ML IJ SOLN
INTRAMUSCULAR | Status: AC
  Filled 2022-12-20: qty 5

## 2022-12-20 MED ORDER — BUPIVACAINE LIPOSOME 1.3 % IJ SUSP
INTRAMUSCULAR | Status: DC | PRN
  Administered 2022-12-20: 50 mL

## 2022-12-20 MED ORDER — ACETAMINOPHEN 500 MG PO TABS
1000.0000 mg | ORAL_TABLET | Freq: Four times a day (QID) | ORAL | Status: DC
  Administered 2022-12-20 – 2022-12-22 (×7): 1000 mg via ORAL
  Filled 2022-12-20 (×9): qty 2

## 2022-12-20 MED ORDER — ACETAMINOPHEN 500 MG PO TABS
1000.0000 mg | ORAL_TABLET | ORAL | Status: AC
  Administered 2022-12-20: 1000 mg via ORAL
  Filled 2022-12-20: qty 2

## 2022-12-20 MED ORDER — HYDROMORPHONE HCL 1 MG/ML IJ SOLN
0.5000 mg | INTRAMUSCULAR | Status: DC | PRN
  Administered 2022-12-20 – 2022-12-21 (×7): 0.5 mg via INTRAVENOUS
  Filled 2022-12-20 (×7): qty 0.5

## 2022-12-20 MED ORDER — HYDROMORPHONE HCL 1 MG/ML IJ SOLN
0.2500 mg | INTRAMUSCULAR | Status: DC | PRN
  Administered 2022-12-20: 0.25 mg via INTRAVENOUS
  Administered 2022-12-20: 0.5 mg via INTRAVENOUS

## 2022-12-20 MED ORDER — DEXAMETHASONE SODIUM PHOSPHATE 10 MG/ML IJ SOLN
INTRAMUSCULAR | Status: DC | PRN
  Administered 2022-12-20: 10 mg via INTRAVENOUS

## 2022-12-20 SURGICAL SUPPLY — 90 items
BAG COUNTER SPONGE SURGICOUNT (BAG) ×1 IMPLANT
BAG SPNG CNTER NS LX DISP (BAG) ×1
BLADE EXTENDED COATED 6.5IN (ELECTRODE) IMPLANT
CANNULA REDUCER 12-8 DVNC XI (CANNULA) IMPLANT
CELLS DAT CNTRL 66122 CELL SVR (MISCELLANEOUS) ×1 IMPLANT
COVER SURGICAL LIGHT HANDLE (MISCELLANEOUS) ×2 IMPLANT
COVER TIP SHEARS 8 DVNC (MISCELLANEOUS) ×1 IMPLANT
DRAIN CHANNEL 19F RND (DRAIN) IMPLANT
DRAPE ARM DVNC X/XI (DISPOSABLE) ×4 IMPLANT
DRAPE COLUMN DVNC XI (DISPOSABLE) ×1 IMPLANT
DRAPE SURG IRRIG POUCH 19X23 (DRAPES) ×1 IMPLANT
DRIVER NDL LRG 8 DVNC XI (INSTRUMENTS) ×1 IMPLANT
DRIVER NDLE LRG 8 DVNC XI (INSTRUMENTS) ×1 IMPLANT
DRSG OPSITE POSTOP 4X10 (GAUZE/BANDAGES/DRESSINGS) IMPLANT
DRSG OPSITE POSTOP 4X6 (GAUZE/BANDAGES/DRESSINGS) IMPLANT
DRSG OPSITE POSTOP 4X8 (GAUZE/BANDAGES/DRESSINGS) IMPLANT
ELECT PENCIL ROCKER SW 15FT (MISCELLANEOUS) ×1 IMPLANT
ELECT REM PT RETURN 15FT ADLT (MISCELLANEOUS) ×1 IMPLANT
ENDOLOOP SUT PDS II  0 18 (SUTURE)
ENDOLOOP SUT PDS II 0 18 (SUTURE) IMPLANT
EVACUATOR SILICONE 100CC (DRAIN) IMPLANT
GLOVE BIO SURGEON STRL SZ 6.5 (GLOVE) ×3 IMPLANT
GLOVE BIOGEL PI IND STRL 7.0 (GLOVE) ×2 IMPLANT
GLOVE INDICATOR 6.5 STRL GRN (GLOVE) ×1 IMPLANT
GOWN SRG XL LVL 4 BRTHBL STRL (GOWNS) ×1 IMPLANT
GOWN STRL NON-REIN XL LVL4 (GOWNS) ×1
GOWN STRL REUS W/ TWL XL LVL3 (GOWN DISPOSABLE) ×3 IMPLANT
GOWN STRL REUS W/TWL XL LVL3 (GOWN DISPOSABLE) ×3
GRASPER SUT TROCAR 14GX15 (MISCELLANEOUS) IMPLANT
GRASPER TIP-UP FEN DVNC XI (INSTRUMENTS) ×1 IMPLANT
HOLDER FOLEY CATH W/STRAP (MISCELLANEOUS) ×1 IMPLANT
IRRIG SUCT STRYKERFLOW 2 WTIP (MISCELLANEOUS) ×1
IRRIGATION SUCT STRKRFLW 2 WTP (MISCELLANEOUS) ×1 IMPLANT
KIT PROCEDURE DVNC SI (MISCELLANEOUS) IMPLANT
KIT TURNOVER KIT A (KITS) IMPLANT
NDL INSUFFLATION 14GA 120MM (NEEDLE) ×1 IMPLANT
NEEDLE INSUFFLATION 14GA 120MM (NEEDLE) ×1 IMPLANT
PACK CARDIOVASCULAR III (CUSTOM PROCEDURE TRAY) ×1 IMPLANT
PACK COLON (CUSTOM PROCEDURE TRAY) ×1 IMPLANT
PAD POSITIONING PINK XL (MISCELLANEOUS) ×1 IMPLANT
RELOAD STAPLE 60 2.5 WHT DVNC (STAPLE) IMPLANT
RELOAD STAPLE 60 3.5 BLU DVNC (STAPLE) IMPLANT
RELOAD STAPLE 60 4.3 GRN DVNC (STAPLE) IMPLANT
RELOAD STAPLER 2.5X60 WHT DVNC (STAPLE) ×1 IMPLANT
RELOAD STAPLER 3.5X60 BLU DVNC (STAPLE) ×1 IMPLANT
RELOAD STAPLER 4.3X60 GRN DVNC (STAPLE) ×1 IMPLANT
RETRACTOR WND ALEXIS 18 MED (MISCELLANEOUS) IMPLANT
RTRCTR WOUND ALEXIS 18CM MED (MISCELLANEOUS) ×1
SCISSORS LAP 5X35 DISP (ENDOMECHANICALS) IMPLANT
SCISSORS MNPLR CVD DVNC XI (INSTRUMENTS) ×1 IMPLANT
SEAL UNIV 5-12 XI (MISCELLANEOUS) ×3 IMPLANT
SEALER VESSEL EXT DVNC XI (MISCELLANEOUS) ×1 IMPLANT
SOL ELECTROSURG ANTI STICK (MISCELLANEOUS) ×1
SOLUTION ELECTROSURG ANTI STCK (MISCELLANEOUS) ×1 IMPLANT
SPIKE FLUID TRANSFER (MISCELLANEOUS) IMPLANT
STAPLER 60 SUREFORM DVNC (STAPLE) IMPLANT
STAPLER ECHELON POWER CIR 29 (STAPLE) IMPLANT
STAPLER ECHELON POWER CIR 31 (STAPLE) IMPLANT
STAPLER RELOAD 2.5X60 WHT DVNC (STAPLE) ×1
STAPLER RELOAD 3.5X60 BLU DVNC (STAPLE) ×1
STAPLER RELOAD 4.3X60 GRN DVNC (STAPLE) ×1
STOPCOCK 4 WAY LG BORE MALE ST (IV SETS) ×2 IMPLANT
SUT ETHILON 2 0 PS N (SUTURE) IMPLANT
SUT NOVA NAB GS-21 1 T12 (SUTURE) ×2 IMPLANT
SUT PROLENE 2 0 KS (SUTURE) IMPLANT
SUT SILK 2 0 (SUTURE) ×1
SUT SILK 2 0 SH CR/8 (SUTURE) IMPLANT
SUT SILK 2-0 18XBRD TIE 12 (SUTURE) ×1 IMPLANT
SUT SILK 3 0 (SUTURE)
SUT SILK 3 0 SH CR/8 (SUTURE) ×1 IMPLANT
SUT SILK 3-0 18XBRD TIE 12 (SUTURE) IMPLANT
SUT V-LOC BARB 180 2/0GR6 GS22 (SUTURE) ×2
SUT VIC AB 2-0 SH 18 (SUTURE) IMPLANT
SUT VIC AB 2-0 SH 27 (SUTURE)
SUT VIC AB 2-0 SH 27X BRD (SUTURE) IMPLANT
SUT VIC AB 3-0 SH 18 (SUTURE) IMPLANT
SUT VIC AB 4-0 PS2 27 (SUTURE) ×2 IMPLANT
SUT VICRYL 0 UR6 27IN ABS (SUTURE) ×1 IMPLANT
SUTURE V-LC BRB 180 2/0GR6GS22 (SUTURE) IMPLANT
SYR 20ML ECCENTRIC (SYRINGE) ×1 IMPLANT
SYS LAPSCP GELPORT 120MM (MISCELLANEOUS)
SYS WOUND ALEXIS 18CM MED (MISCELLANEOUS)
SYSTEM LAPSCP GELPORT 120MM (MISCELLANEOUS) IMPLANT
SYSTEM WOUND ALEXIS 18CM MED (MISCELLANEOUS) IMPLANT
TOWEL OR 17X26 10 PK STRL BLUE (TOWEL DISPOSABLE) IMPLANT
TOWEL OR NON WOVEN STRL DISP B (DISPOSABLE) ×1 IMPLANT
TRAY FOLEY MTR SLVR 16FR STAT (SET/KITS/TRAYS/PACK) ×1 IMPLANT
TROCAR ADV FIXATION 5X100MM (TROCAR) ×1 IMPLANT
TUBING CONNECTING 10 (TUBING) ×2 IMPLANT
TUBING INSUFFLATION 10FT LAP (TUBING) ×1 IMPLANT

## 2022-12-20 NOTE — Anesthesia Procedure Notes (Signed)
Procedure Name: Intubation Date/Time: 12/20/2022 7:38 AM  Performed by: Kizzie Fantasia, CRNAPre-anesthesia Checklist: Patient identified, Emergency Drugs available, Suction available, Patient being monitored and Timeout performed Patient Re-evaluated:Patient Re-evaluated prior to induction Oxygen Delivery Method: Circle system utilized Preoxygenation: Pre-oxygenation with 100% oxygen Induction Type: IV induction Ventilation: Mask ventilation without difficulty Laryngoscope Size: Mac and 4 Grade View: Grade I Tube type: Oral Tube size: 7.5 mm Number of attempts: 1 Airway Equipment and Method: Stylet Placement Confirmation: ETT inserted through vocal cords under direct vision, breath sounds checked- equal and bilateral and positive ETCO2 Secured at: 23 cm Tube secured with: Tape Dental Injury: Teeth and Oropharynx as per pre-operative assessment

## 2022-12-20 NOTE — Transfer of Care (Signed)
Immediate Anesthesia Transfer of Care Note  Patient: Mathew Sexton  Procedure(s) Performed: ROBOTIC RIGHT COLECTOMY (Abdomen) UMBILICAL HERNIA REPAIR (Abdomen)  Patient Location: PACU  Anesthesia Type:General  Level of Consciousness: awake, alert , and oriented  Airway & Oxygen Therapy: Patient Spontanous Breathing and Patient connected to face mask oxygen  Post-op Assessment: Report given to RN and Post -op Vital signs reviewed and stable  Post vital signs: Reviewed and stable  Last Vitals:  Vitals Value Taken Time  BP 120/73 12/20/22 1045  Temp    Pulse 70 12/20/22 1046  Resp 16 12/20/22 1046  SpO2 98 % 12/20/22 1046  Vitals shown include unvalidated device data.  Last Pain:  Vitals:   12/20/22 0627  TempSrc: Oral         Complications: No notable events documented.

## 2022-12-20 NOTE — Progress Notes (Signed)
  Transition of Care (TOC) Screening Note   Patient Details  Name: Mathew Sexton Date of Birth: 10-12-1965   Transition of Care [facility]) CM/SW Contact:    Adrian Prows, RN Phone Number: 12/20/2022, 1:09 PM    Transition of Care Department [facility]) has reviewed patient and no TOC needs have been identified at this time. We will continue to monitor patient advancement through interdisciplinary progression rounds. If new patient transition needs arise, please place a TOC consult.

## 2022-12-20 NOTE — Anesthesia Postprocedure Evaluation (Signed)
Anesthesia Post Note  Patient: Griffon L Calip  Procedure(s) Performed: ROBOTIC RIGHT COLECTOMY (Abdomen) UMBILICAL HERNIA REPAIR (Abdomen)     Patient location during evaluation: PACU Anesthesia Type: General Level of consciousness: awake and alert Pain management: pain level controlled Vital Signs Assessment: post-procedure vital signs reviewed and stable Respiratory status: spontaneous breathing, nonlabored ventilation, respiratory function stable and patient connected to nasal cannula oxygen Cardiovascular status: blood pressure returned to baseline and stable Postop Assessment: no apparent nausea or vomiting Anesthetic complications: no  No notable events documented.  Last Vitals:  Vitals:   12/20/22 1145 12/20/22 1200  BP: 125/69 120/72  Pulse: 66 69  Resp: 17 14  Temp: 36.7 C   SpO2: 94% 95%    Last Pain:  Vitals:   12/20/22 1200  TempSrc:   PainSc: Asleep                 Carman Essick,W. EDMOND

## 2022-12-20 NOTE — Op Note (Addendum)
12/20/2022  10:36 AM  PATIENT:  Mathew Sexton  57 y.o. male  Patient Care Team: Tally Joe, MD as PCP - General (Family Medicine)  PRE-OPERATIVE DIAGNOSIS:  COLON POLYP, UMBILICAL HERNIA  POST-OPERATIVE DIAGNOSIS:  COLON POLYP, UMBILICAL HERNIA  PROCEDURE:  ROBOTIC RIGHT COLECTOMY UMBILICAL HERNIA REPAIR    Surgeon(s): Romie Levee, MD Axel Filler, MD  ASSISTANT: Dr Derrell Lolling   ANESTHESIA:   local and general  EBL:  Total I/O In: 2100 [I.V.:2000; IV Piggyback:100] Out: 100 [Urine:50; Blood:50]  SPECIMEN:  Source of Specimen:  R colon  DISPOSITION OF SPECIMEN:  PATHOLOGY  COUNTS:  YES  PLAN OF CARE: Admit to inpatient   PATIENT DISPOSITION:  PACU - hemodynamically stable.   INDICATIONS: This is a 57 y.o. male who presented to my office with an endoscopically unresectable ileocecal valve polyp. The risk and benefits and alternative treatments were explained to the patient prior to the OR and the patient has elected to proceed with robotic right colectomy.  Consent was signed and placed on chart prior to the OR.   OR FINDINGS: short ascending colon, significant mesenteric fat  DESCRIPTION:  The patient was identified & brought into the operating room. The patient was positioned supine with arms tucked. SCDs were active during the entire case. The patient underwent general anesthesia without any difficulty. A foley catheter was inserted under sterile conditions. The abdomen was prepped and draped in a sterile fashion. A Surgical Timeout confirmed our plan.  I began by inserting a Veress needle in the left upper quadrant.  After confirming good placement, the abdomen was insufflated approximately 15 mmHg.  The port was placed in the left upper quadrant at this time.  Camera inspection revealed no injury at the port site or the varies site.  I placed additional ports under direct laparoscopic visualization.  I evaluated the entire abdomen laparoscopically.  The liver  appeared normal, the large and small bowel were normal as well.  There were no signs of metastatic disease.  There was significant mesenteric fat within the abdomen making it difficult to retract the necessary structures.   I began by identifying the ileocolic artery and vein within the mesentery. Dissection was bluntly carried around these structures. The duodenum was identified and free from the structures. I then separated the structures bluntly and used the robotic vessel sealer device to transect these separately.  I developed the retroperitoneal plane bluntly.  I then freed the appendix off its attachments to the pelvic wall. I mobilized the terminal ileum.  I took care to avoid injuring any retroperitoneal structures.  After this I began to mobilize laterally up the white line of Toldt and then took this down to the hepatic flexure using the vessel sealer device. I mobilized the omentum off of the right transverse colon. The entire colon was then flipped medially and mobilized off of the retroperitoneal structures until I could visualize the lateral edge of the duodenum underneath.  I gently freed the duodenal attachments.  I divided the mesentery up to the level of the proximal transverse colon using the robotic vessel sealer.  I divided the terminal ileal mesentery in similar fashion.  We then used a robotic blue load stapler to transect the terminal ileum and a robotic green load stapler to transect the transverse colon.  This allowed the specimen to be freed.  2.5 mg of indocyanine green green was given intravascularly to evaluate for perfusion.  There was good perfusion of the remaining transverse colon as  well as the terminal ileum.  I then cleared the epiploic appendages from the top of the colon and arranged an anastomosis in a isoperistaltic fashion.  An enterotomy was made in the small and large bowel.  A white load robotic stapler was then used to create the anastomosis.  The common enterotomy  channel was then closed using 2, 2 oh V-Loc sutures.  Complete the omentum was brought down over the bowel and the right upper quadrant was irrigated with normal saline.  Hemostasis was good.  The robot was undocked.  I enlarged my 12 mm port at the umbilical hernia site and removed the hernia sac from the wound.  The hernia measured approximately 3 x 3 cm.  The fascia was then closed using #1 Novafil interrupted sutures.  The subcutaneous tissue of the extraction incision was closed using interrupted 2-0 Vicryl sutures. The skin was then closed using 4-0 Vicryl sutures. Dermabond was placed on the port sites and a sterile dressing was placed over the abdominal incision. All counts were correct per operating room staff. The patient was then awakened from anesthesia and sent to the post anesthesia care unit in stable condition.   Vanita Panda, MD  Colorectal and General Surgery [facility] Surgery

## 2022-12-20 NOTE — Interval H&P Note (Signed)
History and Physical Interval Note:  12/20/2022 7:16 AM  Mathew Sexton  has presented today for surgery, with the diagnosis of COLON POLYP, UMBILICAL HERNIA.  The various methods of treatment have been discussed with the patient and family. After consideration of risks, benefits and other options for treatment, the patient has consented to  Procedure(s): ROBOTIC PARTIAL COLECTOMY (N/A) UMBILICAL HERNIA REPAIR (N/A) as a surgical intervention.  The patient's history has been reviewed, patient examined, no change in status, stable for surgery.  I have reviewed the patient's chart and labs.  Questions were answered to the patient's satisfaction.     Vanita Panda, MD  Colorectal and General Surgery [facility] Surgery

## 2022-12-21 ENCOUNTER — Encounter (HOSPITAL_COMMUNITY): Payer: Self-pay | Admitting: General Surgery

## 2022-12-21 LAB — CBC
HCT: 44.9 % (ref 39.0–52.0)
Hemoglobin: 14.6 g/dL (ref 13.0–17.0)
MCH: 31.5 pg (ref 26.0–34.0)
MCHC: 32.5 g/dL (ref 30.0–36.0)
MCV: 97 fL (ref 80.0–100.0)
Platelets: 297 10*3/uL (ref 150–400)
RBC: 4.63 MIL/uL (ref 4.22–5.81)
RDW: 13.2 % (ref 11.5–15.5)
WBC: 19.7 10*3/uL — ABNORMAL HIGH (ref 4.0–10.5)
nRBC: 0 % (ref 0.0–0.2)

## 2022-12-21 LAB — BASIC METABOLIC PANEL
Anion gap: 6 (ref 5–15)
BUN: 13 mg/dL (ref 6–20)
CO2: 23 mmol/L (ref 22–32)
Calcium: 8.8 mg/dL — ABNORMAL LOW (ref 8.9–10.3)
Chloride: 105 mmol/L (ref 98–111)
Creatinine, Ser: 1.16 mg/dL (ref 0.61–1.24)
GFR, Estimated: >60 mL/min (ref >60)
Glucose, Bld: 141 mg/dL — ABNORMAL HIGH (ref 70–99)
Potassium: 4.9 mmol/L (ref 3.5–5.1)
Sodium: 134 mmol/L — ABNORMAL LOW (ref 135–145)

## 2022-12-21 NOTE — Progress Notes (Signed)
1 Day Post-Op Robotic R colectomy Subjective: Doing well. No nausea, tolerating liquids.  No flatus or BM.  Foley out  Objective: Vital signs in last 24 hours: Temp:  [97.4 F (36.3 C)-98 F (36.7 C)] 97.4 F (36.3 C) (05/24 0450) Pulse Rate:  [66-86] 66 (05/24 0450) Resp:  [14-18] 16 (05/24 0450) BP: (110-133)/(58-87) 112/59 (05/24 0450) SpO2:  [91 %-98 %] 91 % (05/24 0450) Weight:  [127.4 kg] 127.4 kg (05/24 0500)   Intake/Output from previous day: 05/23 0701 - 05/24 0700 In: 4639.8 [P.O.:1300; I.V.:3239.8; IV Piggyback:100] Out: 4500 [Urine:4350; Emesis/NG output:100; Blood:50] Intake/Output this shift: No intake/output data recorded.   General appearance: alert and cooperative GI: normal findings: soft, non-tender  Incision: no significant drainage  Lab Results:  Recent Labs    12/21/22 0445  WBC 19.7*  HGB 14.6  HCT 44.9  PLT 297   BMET Recent Labs    12/21/22 0445  NA 134*  K 4.9  CL 105  CO2 23  GLUCOSE 141*  BUN 13  CREATININE 1.16  CALCIUM 8.8*   PT/INR No results for input(s): "LABPROT", "INR" in the last 72 hours. ABG No results for input(s): "PHART", "HCO3" in the last 72 hours.  Invalid input(s): "PCO2", "PO2"  MEDS, Scheduled  acetaminophen  1,000 mg Oral Q6H   alvimopan  12 mg Oral BID   enoxaparin (LOVENOX) injection  40 mg Subcutaneous Q24H   feeding supplement  237 mL Oral BID BM   gabapentin  300 mg Oral BID   saccharomyces boulardii  250 mg Oral BID    Studies/Results: No results found.  Assessment: s/p Procedure(s): ROBOTIC RIGHT COLECTOMY UMBILICAL HERNIA REPAIR Patient Active Problem List   Diagnosis Date Noted   Colon polyp 12/20/2022    Expected post op course  Plan: Ambulate in hall SL IVF's Cont fulls for now   LOS: 1 day     .Vanita Panda, MD [facility] Surgery, [place]    12/21/2022 9:09 AM

## 2022-12-22 LAB — BASIC METABOLIC PANEL
Anion gap: 4 — ABNORMAL LOW (ref 5–15)
BUN: 14 mg/dL (ref 6–20)
CO2: 27 mmol/L (ref 22–32)
Calcium: 8.6 mg/dL — ABNORMAL LOW (ref 8.9–10.3)
Chloride: 104 mmol/L (ref 98–111)
Creatinine, Ser: 1.2 mg/dL (ref 0.61–1.24)
GFR, Estimated: >60 mL/min (ref >60)
Glucose, Bld: 105 mg/dL — ABNORMAL HIGH (ref 70–99)
Potassium: 4.3 mmol/L (ref 3.5–5.1)
Sodium: 135 mmol/L (ref 135–145)

## 2022-12-22 LAB — CBC
HCT: 40.5 % (ref 39.0–52.0)
Hemoglobin: 13.2 g/dL (ref 13.0–17.0)
MCH: 32.2 pg (ref 26.0–34.0)
MCHC: 32.6 g/dL (ref 30.0–36.0)
MCV: 98.8 fL (ref 80.0–100.0)
Platelets: 240 10*3/uL (ref 150–400)
RBC: 4.1 MIL/uL — ABNORMAL LOW (ref 4.22–5.81)
RDW: 13.4 % (ref 11.5–15.5)
WBC: 14 10*3/uL — ABNORMAL HIGH (ref 4.0–10.5)
nRBC: 0 % (ref 0.0–0.2)

## 2022-12-22 MED ORDER — TRAMADOL HCL 50 MG PO TABS: 50.0000 mg | ORAL_TABLET | Freq: Four times a day (QID) | ORAL | 0 refills | Status: DC | PRN

## 2022-12-22 NOTE — Discharge Summary (Signed)
Physician Discharge Summary  Patient ID: Mathew Sexton MRN: [id number] DOB/AGE: Aug 16, 1965 57 y.o.  Admit date: 12/20/2022 Discharge date: 12/22/2022  Admission Diagnoses:  Discharge Diagnoses:  Principal Problem:   Colon polyp   Discharged Condition: good  Hospital Course: Patient was admitted to the med surg floor after surgery.  Diet was advanced as tolerated.  Patient began to have bowel function on postop day 1.  By postop day 2, he was tolerating a solid diet and pain was controlled with oral medications.  He was urinating without difficulty and ambulating without assistance.  Patient was felt to be in stable condition for discharge to home.   Consults: None  Significant Diagnostic Studies: labs: cbc, bmet  Treatments: IV hydration, analgesia: acetaminophen and Tramadol, and surgery: Robotic R colectomy  Discharge Exam: Blood pressure 133/77, pulse (!) 58, temperature 97.6 F (36.4 C), temperature source Oral, resp. rate 18, height 6' (1.829 m), weight 126.7 kg, SpO2 93 %. General appearance: alert and cooperative GI: soft, non-distended Incision/Wound: clean, dry, intact  Disposition: Discharge disposition: 01-Home or Self Care        Allergies as of 12/22/2022   No Known Allergies      Medication List     STOP taking these medications    GOODY HEADACHE PO       TAKE these medications    acetaminophen 325 MG tablet Commonly known as: TYLENOL Take 650 mg by mouth every 6 (six) hours as needed for pain.   sildenafil 100 MG tablet Commonly known as: VIAGRA Take 100 mg by mouth as needed for erectile dysfunction.   traMADol 50 MG tablet Commonly known as: ULTRAM Take 1-2 tablets (50-100 mg total) by mouth every 6 (six) hours as needed for moderate pain.        Follow-up Information     Romie Levee, MD. Schedule an appointment as soon as possible for a visit in 3 week(s).   Specialties: General Surgery, Colon and Rectal Surgery Why: or as  already scheduled Contact information: [address] [place] [address] [place] [address] [phone]                 Signed: Vanita Panda 12/22/2022, 7:48 AM

## 2022-12-22 NOTE — Plan of Care (Signed)

## 2022-12-22 NOTE — Discharge Instructions (Signed)
SURGERY: POST OP INSTRUCTIONS (Surgery for small bowel obstruction, colon resection, etc)   ######################################################################  EAT Gradually transition to a high fiber diet with a fiber supplement over the next few days after discharge  WALK Walk an hour a day.  Control your pain to do that.    CONTROL PAIN Control pain so that you can walk, sleep, tolerate sneezing/coughing, go up/down stairs.  HAVE A BOWEL MOVEMENT DAILY Keep your bowels regular to avoid problems.  OK to try a laxative to override constipation.  OK to use an antidairrheal to slow down diarrhea.  Call if not better after 2 tries  CALL IF YOU HAVE PROBLEMS/CONCERNS Call if you are still struggling despite following these instructions. Call if you have concerns not answered by these instructions  ######################################################################   DIET Follow a light diet the first few days at home.  Start with a bland diet such as soups, liquids, starchy foods, low fat foods, etc.  If you feel full, bloated, or constipated, stay on a ful liquid or pureed/blenderized diet for a few days until you feel better and no longer constipated. Be sure to drink plenty of fluids every day to avoid getting dehydrated (feeling dizzy, not urinating, etc.). Gradually add a fiber supplement to your diet over the next week.  Gradually get back to a regular solid diet.  Avoid fast food or heavy meals the first week as you are more likely to get nauseated. It is expected for your digestive tract to need a few months to get back to normal.  It is common for your bowel movements and stools to be irregular.  You will have occasional bloating and cramping that should eventually fade away.  Until you are eating solid food normally, off all pain medications, and back to regular activities; your bowels will not be normal. Focus on eating a low-fat, high fiber diet the rest of your life  (See Getting to Good Bowel Health, below).  CARE of your INCISION or WOUND  It is good for closed incisions and even open wounds to be washed every day.  Shower every day.  Short baths are fine.  Wash the incisions and wounds clean with soap & water.    You may leave closed incisions open to air if it is dry.   You may cover the incision with clean gauze & replace it after your daily shower for comfort.  STAPLES: You have skin staples.  Leave them in place & set up an appointment for them to be removed by a surgery office nurse ~10 days after surgery. = 1st week of January 2024    ACTIVITIES as tolerated Start light daily activities --- self-care, walking, climbing stairs-- beginning the day after surgery.  Gradually increase activities as tolerated.  Control your pain to be active.  Stop when you are tired.  Ideally, walk several times a day, eventually an hour a day.   Most people are back to most day-to-day activities in a few weeks.  It takes 4-8 weeks to get back to unrestricted, intense activity. If you can walk 30 minutes without difficulty, it is safe to try more intense activity such as jogging, treadmill, bicycling, low-impact aerobics, swimming, etc. Save the most intensive and strenuous activity for last (Usually 4-8 weeks after surgery) such as sit-ups, heavy lifting, contact sports, etc.  Refrain from any intense heavy lifting or straining until you are off narcotics for pain control.  You will have off days, but things should improve   week-by-week. DO NOT PUSH THROUGH PAIN.  Let pain be your guide: If it hurts to do something, don't do it.  Pain is your body warning you to avoid that activity for another week until the pain goes down. You may drive when you are no longer taking narcotic prescription pain medication, you can comfortably wear a seatbelt, and you can safely make sudden turns/stops to protect yourself without hesitating due to pain. You may have sexual intercourse when it  is comfortable. If it hurts to do something, stop.  MEDICATIONS Take your usually prescribed home medications unless otherwise directed.   Blood thinners:  Usually you can restart any strong blood thinners after the second postoperative day.  It is OK to take aspirin right away.     If you are on strong blood thinners (warfarin/Coumadin, Plavix, Xerelto, Eliquis, Pradaxa, etc), discuss with your surgeon, medicine PCP, and/or cardiologist for instructions on when to restart the blood thinner & if blood monitoring is needed (PT/INR blood check, etc).     PAIN CONTROL Pain after surgery or related to activity is often due to strain/injury to muscle, tendon, nerves and/or incisions.  This pain is usually short-term and will improve in a few months.  To help speed the process of healing and to get back to regular activity more quickly, DO THE FOLLOWING THINGS TOGETHER: Increase activity gradually.  DO NOT PUSH THROUGH PAIN Use Ice and/or Heat Try Gentle Massage and/or Stretching Take over the counter pain medication Take Narcotic prescription pain medication for more severe pain  Good pain control = faster recovery.  It is better to take more medicine to be more active than to stay in bed all day to avoid medications.  Increase activity gradually Avoid heavy lifting at first, then increase to lifting as tolerated over the next 6 weeks. Do not "push through" the pain.  Listen to your body and avoid positions and maneuvers than reproduce the pain.  Wait a few days before trying something more intense Walking an hour a day is encouraged to help your body recover faster and more safely.  Start slowly and stop when getting sore.  If you can walk 30 minutes without stopping or pain, you can try more intense activity (running, jogging, aerobics, cycling, swimming, treadmill, sex, sports, weightlifting, etc.) Remember: If it hurts to do it, then don't do it! Use Ice and/or Heat You will have swelling and  bruising around the incisions.  This will take several weeks to resolve. Ice packs or heating pads (6-8 times a day, 30-60 minutes at a time) will help sooth soreness & bruising. Some people prefer to use ice alone, heat alone, or alternate between ice & heat.  Experiment and see what works best for you.  Consider trying ice for the first few days to help decrease swelling and bruising; then, switch to heat to help relax sore spots and speed recovery. Shower every day.  Short baths are fine.  It feels good!  Keep the incisions and wounds clean with soap & water.   Try Gentle Massage and/or Stretching Massage at the area of pain many times a day Stop if you feel pain - do not overdo it Take over the counter pain medication This helps the muscle and nerve tissues become less irritable and calm down faster Choose ONE of the following over-the-counter anti-inflammatory medications: Acetaminophen 500mg tabs (Tylenol) 1-2 pills with every meal and just before bedtime (avoid if you have liver problems or if you have   acetaminophen in you narcotic prescription) Naproxen 220mg tabs (ex. Aleve, Naprosyn) 1-2 pills twice a day (avoid if you have kidney, stomach, IBD, or bleeding problems) Ibuprofen 200mg tabs (ex. Advil, Motrin) 3-4 pills with every meal and just before bedtime (avoid if you have kidney, stomach, IBD, or bleeding problems) Take with food/snack several times a day as directed for at least 2 weeks to help keep pain / soreness down & more manageable. Take Narcotic prescription pain medication for more severe pain A prescription for strong pain control is often given to you upon discharge (for example: oxycodone/Percocet, hydrocodone/Norco/Vicodin, or tramadol/Ultram) Take your pain medication as prescribed. Be mindful that most narcotic prescriptions contain Tylenol (acetaminophen) as well - avoid taking too much Tylenol. If you are having problems/concerns with the prescription medicine (does  not control pain, nausea, vomiting, rash, itching, etc.), please call us [phone] to see if we need to switch you to a different pain medicine that will work better for you and/or control your side effects better. If you need a refill on your pain medication, you must call the office before 4 pm and on weekdays only.  By federal law, prescriptions for narcotics cannot be called into a pharmacy.  They must be filled out on paper & picked up from our office by the patient or authorized caretaker.  Prescriptions cannot be filled after 4 pm nor on weekends.    WHEN TO CALL US [phone] Severe uncontrolled or worsening pain  Fever over 101 F (38.5 C) Concerns with the incision: Worsening pain, redness, rash/hives, swelling, bleeding, or drainage Reactions / problems with new medications (itching, rash, hives, nausea, etc.) Nausea and/or vomiting Difficulty urinating Difficulty breathing Worsening fatigue, dizziness, lightheadedness, blurred vision Other concerns If you are not getting better after two weeks or are noticing you are getting worse, contact our office [phone] for further advice.  We may need to adjust your medications, re-evaluate you in the office, send you to the emergency room, or see what other things we can do to help. The clinic staff is available to answer your questions during regular business hours (8:30am-5pm).  Please don't hesitate to call and ask to speak to one of our nurses for clinical concerns.    A surgeon from [facility] is always on call at the hospitals 24 hours/day If you have a medical emergency, go to the nearest emergency room or call 911.  FOLLOW UP in our office One the day of your discharge from the hospital (or the next business weekday), please call [facility] to set up or confirm an appointment to see your surgeon in the office for a follow-up appointment.  Usually it is 2-3 weeks after your surgery.   If you  have skin staples at your incision(s), let the office know so we can set up a time in the office for the nurse to remove them (usually around 10 days after surgery). Make sure that you call for appointments the day of discharge (or the next business weekday) from the hospital to ensure a convenient appointment time. IF YOU HAVE DISABILITY OR FAMILY LEAVE FORMS, BRING THEM TO THE OFFICE FOR PROCESSING.  DO NOT GIVE THEM TO YOUR DOCTOR.  [facility] [address], [address], [place], [place]  [address] ? [phone] - Main [phone] - Toll Free,  [phone] - Fax [email or web address]    GETTING TO GOOD BOWEL HEALTH. It is expected for your digestive tract to   need a few months to get back to normal.  It is common for your bowel movements and stools to be irregular.  You will have occasional bloating and cramping that should eventually fade away.  Until you are eating solid food normally, off all pain medications, and back to regular activities; your bowels will not be normal.   Avoiding constipation The goal: ONE SOFT BOWEL MOVEMENT A DAY!    Drink plenty of fluids.  Choose water first. TAKE A FIBER SUPPLEMENT EVERY DAY THE REST OF YOUR LIFE During your first week back home, gradually add back a fiber supplement every day Experiment which form you can tolerate.   There are many forms such as powders, tablets, wafers, gummies, etc Psyllium bran (Metamucil), methylcellulose (Citrucel), Miralax or Glycolax, Benefiber, Flax Seed.  Adjust the dose week-by-week (1/2 dose/day to 6 doses a day) until you are moving your bowels 1-2 times a day.  Cut back the dose or try a different fiber product if it is giving you problems such as diarrhea or bloating. Sometimes a laxative is needed to help jump-start bowels if constipated until the fiber supplement can help regulate your bowels.  If you are tolerating eating & you are farting, it is okay to try a gentle  laxative such as double dose MiraLax, prune juice, or Milk of Magnesia.  Avoid using laxatives too often. Stool softeners can sometimes help counteract the constipating effects of narcotic pain medicines.  It can also cause diarrhea, so avoid using for too long. If you are still constipated despite taking fiber daily, eating solids, and a few doses of laxatives, call our office. Controlling diarrhea Try drinking liquids and eating bland foods for a few days to avoid stressing your intestines further. Avoid dairy products (especially milk & ice cream) for a short time.  The intestines often can lose the ability to digest lactose when stressed. Avoid foods that cause gassiness or bloating.  Typical foods include beans and other legumes, cabbage, broccoli, and dairy foods.  Avoid greasy, spicy, fast foods.  Every person has some sensitivity to other foods, so listen to your body and avoid those foods that trigger problems for you. Probiotics (such as active yogurt, Align, etc) may help repopulate the intestines and colon with normal bacteria and calm down a sensitive digestive tract Adding a fiber supplement gradually can help thicken stools by absorbing excess fluid and retrain the intestines to act more normally.  Slowly increase the dose over a few weeks.  Too much fiber too soon can backfire and cause cramping & bloating. It is okay to try and slow down diarrhea with a few doses of antidiarrheal medicines.   Bismuth subsalicylate (ex. Kayopectate, Pepto Bismol) for a few doses can help control diarrhea.  Avoid if pregnant.   Loperamide (Imodium) can slow down diarrhea.  Start with one tablet (2mg) first.  Avoid if you are having fevers or severe pain.  ILEOSTOMY PATIENTS WILL HAVE CHRONIC DIARRHEA since their colon is not in use.    Drink plenty of liquids.  You will need to drink even more glasses of water/liquid a day to avoid getting dehydrated. Record output from your ileostomy.  Expect to empty  the bag every 3-4 hours at first.  Most people with a permanent ileostomy empty their bag 4-6 times at the least.   Use antidiarrheal medicine (especially Imodium) several times a day to avoid getting dehydrated.  Start with a dose at bedtime & breakfast.  Adjust up or   down as needed.  Increase antidiarrheal medications as directed to avoid emptying the bag more than 8 times a day (every 3 hours). Work with your wound ostomy nurse to learn care for your ostomy.  See ostomy care instructions. TROUBLESHOOTING IRREGULAR BOWELS 1) Start with a soft & bland diet. No spicy, greasy, or fried foods.  2) Avoid gluten/wheat or dairy products from diet to see if symptoms improve. 3) Miralax 17gm or flax seed mixed in 8oz. water or juice-daily. May use 2-4 times a day as needed. 4) Gas-X, Phazyme, etc. as needed for gas & bloating.  5) Prilosec (omeprazole) over-the-counter as needed 6)  Consider probiotics (Align, Activa, etc) to help calm the bowels down  Call your doctor if you are getting worse or not getting better.  Sometimes further testing (cultures, endoscopy, X-ray studies, CT scans, bloodwork, etc.) may be needed to help diagnose and treat the cause of the diarrhea. [facility] [address], [address], , [place]  [address] [phone] - Main.    [phone]  - Toll Free.   [phone] - Fax [email or web address]   ###############################   #######################################################  Ostomy Support Information  You've heard that people get along just fine with only one of their eyes, or one of their lungs, or one of their kidneys. But you also know that you have only one intestine and only one bladder, and that leaves you feeling awfully empty, both physically and emotionally: You think no other people go around without part of their intestine with the ends of their intestines sticking out through their abdominal walls.    YOU ARE NOT ALONE.  There are nearly three quarters of a million people in the [place] who have an ostomy; people who have had surgery to remove all or part of their colons or bladders.   There is even a national association, the [employer] with over 350 local affiliated support groups that are organized by volunteers who provide peer support and counseling. [employer] has a toll free telephone num-ber, 800-826-0826 and an educational, interactive website, www.ostomy.org   An ostomy is an opening in the belly (abdominal wall) made by surgery. Ostomates are people who have had this procedure. The opening (stoma) allows the kidney or bowel to grdischarge waste. An external pouch covers the stoma to collect waste. Pouches are are a simple bag and are odor free. Different companies have disposable or reusable pouches to fit one's lifestyle. An ostomy can either be temporary or permanent.   THERE ARE THREE MAIN TYPES OF OSTOMIES Colostomy. A colostomy is a surgically created opening in the large intestine (colon). Ileostomy. An ileostomy is a surgically created opening in the small intestine. Urostomy. A urostomy is a surgically created opening to divert urine away from the bladder.  OSTOMY Care  The following guidelines will make care of your colostomy easier. Keep this information close by for quick reference.  Helpful DIET hints Eat a well-balanced diet including vegetables and fresh fruits. Eat on a regular schedule.  Drink at least 6 to 8 glasses of fluids daily. Eat slowly in a relaxed atmosphere. Chew your food thoroughly. Avoid chewing gum, smoking, and drinking from a straw. This will help decrease the amount of air you swallow, which may help reduce gas. Eating yogurt or drinking buttermilk may help reduce gas.  To control gas at night, do not eat after 8 p.m. This will give your bowel time to quiet down before you go   to bed.  If gas is a problem, you can purchase  Beano. Sprinkle Beano on the first bite of food before eating to reduce gas. It has no flavor and should not change the taste of your food. You can buy Beano over the counter at your local drugstore.  Foods like fish, onions, garlic, broccoli, asparagus, and cabbage produce odor. Although your pouch is odor-proof, if you eat these foods you may notice a stronger odor when emptying your pouch. If this is a concern, you may want to limit these foods in your diet.  If you have an ileostomy, you will have chronic diarrhea & need to drink more liquids to avoid getting dehydrated.  Consider antidiarrheal medicine like imodium (loperamide) or Lomotil to help slow down bowel movements / diarrhea into your ileostomy bag.  GETTING TO GOOD BOWEL HEALTH WITH AN ILEOSTOMY    With the colon bypassed & not in use, you will have small bowel diarrhea.   It is important to thicken & slow your bowel movements down.   The goal: 4-6 small BOWEL MOVEMENTS A DAY It is important to drink plenty of liquids to avoid getting dehydrated  CONTROLLING ILEOSTOMY DIARRHEA  TAKE A FIBER SUPPLEMENT (FiberCon or Benefiner soluble fiber) twice a day - to thicken stools by absorbing excess fluid and retrain the intestines to act more normally.  Slowly increase the dose over a few weeks.  Too much fiber too soon can backfire and cause cramping & bloating.  TAKE AN IRON SUPPLEMENT twice a day to naturally constipate your bowels.  Usually ferrous sulfate 325mg twice a day)  TAKE ANTI-DIARRHEAL MEDICINES: Loperamide (Imodium) can slow down diarrhea.  Start with two tablets (= 4mg) first and then try one tablet every 6 hours.  Can go up to 2 pills four times day (8 pills of 2mg max) Avoid if you are having fevers or severe pain.  If you are not better or start feeling worse, stop all medicines and call your doctor for advice LoMotil (Diphenoxylate / Atropine) is another medicine that can constipate & slow down bowel moevements Pepto  Bismol (bismuth) can gently thicken bowels as well  If diarrhea is worse,: drink plenty of liquids and try simpler foods for a few days to avoid stressing your intestines further. Avoid dairy products (especially milk & ice cream) for a short time.  The intestines often can lose the ability to digest lactose when stressed. Avoid foods that cause gassiness or bloating.  Typical foods include beans and other legumes, cabbage, broccoli, and dairy foods.  Every person has some sensitivity to other foods, so listen to our body and avoid those foods that trigger problems for you.Call your doctor if you are getting worse or not better.  Sometimes further testing (cultures, endoscopy, X-ray studies, bloodwork, etc) may be needed to help diagnose and treat the cause of the diarrhea. Take extra anti-diarrheal medicines (maximum is 8 pills of 2mg loperamide a day)   Tips for POUCHING an OSTOMY   Changing Your Pouch The best time to change your pouch is in the morning, before eating or drinking anything. Your stoma can function at any time, but it will function more after eating or drinking.   Applying the pouching system  Place all your equipment close at hand before removing your pouch.  Wash your hands.  Stand or sit in front of a mirror. Use the position that works best for you. Remember that you must keep the skin around the stoma   wrinkle-free for a good seal.  Gently remove the used pouch (1-piece system) or the pouch and old wafer (2-piece system). Empty the pouch into the toilet. Save the closure clip to use again.  Wash the stoma itself and the skin around the stoma. Your stoma may bleed a little when being washed. This is normal. Rinse and pat dry. You may use a wash cloth or soft paper towels (like Bounty), mild soap (like Dial, Safeguard, or Ivory), and water. Avoid soaps that contain perfumes or lotions.  For a new pouch (1-piece system) or a new wafer (2-piece system), measure your  stoma using the stoma guide in each box of supplies.  Trace the shape of your stoma onto the back of the new pouch or the back of the new wafer. Cut out the opening. Remove the paper backing and set it aside.  Optional: Apply a skin barrier powder to surrounding skin if it is irritated (bare or weeping), and dust off the excess. Optional: Apply a skin-prep wipe (such as Skin Prep or All-Kare) to the skin around the stoma, and let it dry. Do not apply this solution if the skin is irritated (red, tender, or broken) or if you have shaved around the stoma. Optional: Apply a skin barrier paste (such as Stomahesive, Coloplast, or Premium) around the opening cut in the back of the pouch or wafer. Allow it to dry for 30 to 60 seconds.  Hold the pouch (1-piece system) or wafer (2-piece system) with the sticky side toward your body. Make sure the skin around the stoma is wrinkle-free. Center the opening on the stoma, then press firmly to your abdomen (Fig. 4). Look in the mirror to check if you are placing the pouch, or wafer, in the right position. For a 2-piece system, snap the pouch onto the wafer. Make sure it snaps into place securely.  Place your hand over the stoma and the pouch or wafer for about 30 seconds. The heat from your hand can help the pouch or wafer stick to your skin.  Add deodorant (such as Super Banish or Nullo) to your pouch. Other options include food extracts such as vanilla oil and peppermint extract. Add about 10 drops of the deodorant to the pouch. Then apply the closure clamp. Note: Do not use toxic  chemicals or commercial cleaning agents in your pouch. These substances may harm the stoma.  Optional: For extra seal, apply tape to all 4 sides around the pouch or wafer, as if you were framing a picture. You may use any brand of medical adhesive tape. Change your pouch every 5 to 7 days. Change it immediately if a leak occurs.  Wash your hands afterwards.  If you are wearing a  2-piece system, you may use 2 new pouches per week and alternate them. Rinse the pouch with mild soap and warm water and hang it to dry for the next day. Apply the fresh pouch. Alternate the 2 pouches like this for a week. After a week, change the wafer and begin with 2 new pouches. Place the old pouches in a plastic bag, and put them in the trash.   LIVING WITH AN OSTOMY  Emptying Your Pouch Empty your pouch when it is one-third full (of urine, stool, and/or gas). If you wait until your pouch is fuller than this, it will be more difficult to empty and more noticeable. When you empty your pouch, either put toilet paper in the toilet bowl first, or flush the   toilet while you empty the pouch. This will reduce splashing. You can empty the pouch between your legs or to one side while sitting, or while standing or stooping. If you have a 2-piece system, you can snap off the pouch to empty it. Remember that your stoma may function during this time. If you wish to rinse your pouch after you empty it, a turkey baster can be helpful. When using a baster, squirt water up into the pouch through the opening at the bottom. With a 2-piece system, you can snap off the pouch to rinse it. After rinsing  your pouch, empty it into the toilet. When rinsing your pouch at home, put a few granules of Dreft soap in the rinse water. This helps lubricate and freshen your pouch. The inside of your pouch can be sprayed with non-stick cooking oil (Pam spray). This may help reduce stool sticking to the inside of the pouch.  Bathing You may shower or bathe with your pouch on or off. Remember that your stoma may function during this time.  The materials you use to wash your stoma and the skin around it should be clean, but they do not need to be sterile.  Wearing Your Pouch During hot weather, or if you perspire a lot in general, wear a cover over your pouch. This may prevent a rash on your skin under the pouch. Pouch covers are  sold at ostomy supply stores. Wear the pouch inside your underwear for better support. Watch your weight. Any gain or loss of 10 to 15 pounds or more can change the way your pouch fits.  Going Away From Home A collapsible cup (like those that come in travel kits) or a soft plastic squirt bottle with a pull-up top (like a travel bottle for shampoo) can be used for rinsing your pouch when you are away from home. Tilt the opening of the pouch at an upward angle when using a cup to rinse.  Carry wet wipes or extra tissues to use in public bathrooms.  Carry an extra pouching system with you at all times.  Never keep ostomy supplies in the glove compartment of your car. Extreme heat or cold can damage the skin barriers and adhesive wafers on the pouch.  When you travel, carry your ostomy supplies with you at all times. Keep them within easy reach. Do not pack ostomy supplies in baggage that will be checked or otherwise separated from you, because your baggage might be lost. If you're traveling out of the country, it is helpful to have a letter stating that you are carrying ostomy supplies as a medical necessity.  If you need ostomy supplies while traveling, look in the yellow pages of the telephone book under "Surgical Supplies." Or call the local ostomy organization to find out where supplies are available.  Do not let your ostomy supplies get low. Always order new pouches before you use the last one.  Reducing Odor Limit foods such as broccoli, cabbage, onions, fish, and garlic in your diet to help reduce odor. Each time you empty your pouch, carefully clean the opening of the pouch, both inside and outside, with toilet paper. Rinse your pouch 1 or 2 times daily after you empty it (see directions for emptying your pouch and going away from home). Add deodorant (such as Super Banish or Nullo) to your pouch. Use air deodorizers in your bathroom. Do not add aspirin to your pouch. Even though  aspirin can help prevent odor, it   could cause ulcers on your stoma.  When to call the doctor Call the doctor if you have any of the following symptoms: Purple, black, or white stoma Severe cramps lasting more than 6 hours Severe watery discharge from the stoma lasting more than 6 hours No output from the colostomy for 3 days Excessive bleeding from your stoma Swelling of your stoma to more than 1/2-inch larger than usual Pulling inward of your stoma below skin level Severe skin irritation or deep ulcers Bulging or other changes in your abdomen  When to call your ostomy nurse Call your ostomy/enterostomal therapy (WOCN) nurse if any of the following occurs: Frequent leaking of your pouching system Change in size or appearance of your stoma, causing discomfort or problems with your pouch Skin rash or rawness Weight gain or loss that causes problems with your pouch     FREQUENTLY ASKED QUESTIONS   Why haven't you met any of these folks who have an ostomy?  Well, maybe you have! You just did not recognize them because an ostomy doesn't show. It can be kept secret if you wish. Why, maybe some of your best friends, office associates or neighbors have an ostomy ... you never can tell. People facing ostomy surgery have many quality-of-life questions like: Will you bulge? Smell? Make noises? Will you feel waste leaving your body? Will you be a captive of the toilet? Will you starve? Be a social outcast? Get/stay married? Have babies? Easily bathe, go swimming, bend over?  OK, let's look at what you can expect:   Will you bulge?  Remember, without part of the intestine or bladder, and its contents, you should have a flatter tummy than before. You can expect to wear, with little exception, what you wore before surgery ... and this in-cludes tight clothing and bathing suits.   Will you smell?  Today, thanks to modern odor proof pouching systems, you can walk into an ostomy support group  meeting and not smell anything that is foul or offensive. And, for those with an ileostomy or colostomy who are concerned about odor when emptying their pouch, there are in-pouch deodorants that can be used to eliminate any waste odors that may exist.   Will you make noises?  Everyone produces gas, especially if they are an air-swallower. But intestinal sounds that occur from time to time are no differ-ent than a gurgling tummy, and quite often your clothing will muffle any sounds.   Will you feel the waste discharges?  For those with a colostomy or ileostomy there might be a slight pressure when waste leaves your body, but understand that the intestines have no nerve endings, so there will be no unpleasant sensations. Those with a urostomy will probably be unaware of any kidney drainage.   Will you be a captive of the toilet?  Immediately post-op you will spend more time in the bathroom than you will after your body recovers from surgery. Every person is different, but on average those with an ileostomy or urostomy may empty their pouches 4 to 6 times a day; a little  less if you have a colostomy. The average wear time between pouch system changes is 3 to 5 days and the changing process should take less than 30 minutes.   Will I need to be on a special diet? Most people return to their normal diet when they have recovered from surgery. Be sure to chew your food well, eat a well-balanced diet and drink plenty of fluids. If   you experience problems with a certain food, wait a couple of weeks and try it again.  Will there be odor and noises? Pouching systems are designed to be odor-proof or odor-resistant. There are deodorants that can be used in the pouch. Medications are also available to help reduce odor. Limit gas-producing foods and carbonated beverages. You will experience less gas and fewer noises as you heal from surgery.  How much time will it take to care for my ostomy? At first, you may  spend a lot of time learning about your ostomy and how to take care of it. As you become more comfortable and skilled at changing the pouching system, it will take very little time to care for it.   Will I be able to return to work? People with ostomies can perform most jobs. As soon as you have healed from surgery, you should be able to return to work. Heavy lifting (more than 10 pounds) may be discouraged.   What about intimacy? Sexual relationships and intimacy are important and fulfilling aspects of your life. They should continue after ostomy surgery. Intimacy-related concerns should be discussed openly between you and your partner.   Can I wear regular clothing? You do not need to wear special clothing. Ostomy pouches are fairly flat and barely noticeable. Elastic undergarments will not hurt the stoma or prevent the ostomy from functioning.   Can I participate in sports? An ostomy should not limit your involvement in sports. Many people with ostomies are runners, skiers, swimmers or participate in other active lifestyles. Talk with your caregiver first before doing heavy physical activity.  Will you starve?  Not if you follow doctor's orders at each stage of your post-op adjustment. There is no such thing as an "ostomy diet". Some people with an ostomy will be able to eat and tolerate anything; others may find diffi-culty with some foods. Each person is an individual and must determine, by trial, what is best for them. A good practice for all is to drink plenty of water.   Will you be a social outcast?  Have you met anyone who has an ostomy and is a social outcast? Why should you be the first? Only your attitude and self image will effect how you are treated. No confi-dent person is an outcast.    PROFESSIONAL HELP   Resources are available if you need help or have questions about your ostomy.   Specially trained nurses called Wound, Ostomy Continence Nurses (WOCN) are available for  consultation in most major medical centers.  Consider getting an ostomy consult at an outpatient ostomy clinic.   [place] has an Ostomy Clinic run by an WOCN ostomy nurse at the [facility].  [phone]. [facility] can help set up an appointment   The [employer] (UOA) is a group made up of many local chapters throughout the [place]. These local groups hold meetings and provide support to prospective and existing ostomates. They sponsor educational events and have qualified visitors to make personal or telephone visits. Contact the UOA for the chapter nearest you and for other educational publications.  More detailed information can be found in Colostomy Guide, a publication of the [employer] (UOA). Contact UOA at 1-800-826-0826 or visit their web site at www.uoaa.org. The website contains links to other sites, suppliers and resources.  [facility] Secure Start Services: Start at the website to enlist for support.  Your Wound Ostomy (WOCN) nurse may have started this   process. [email or web address]://[email or web address]/securestart Secure Start services are designed to support people as they live their lives with an ostomy or neurogenic bladder. Enrolling is easy and at no cost to the patient. We realize that each person's needs and life journey are different. Through Secure Start services, we want to help people live their life, their way.  #######################################################  

## 2022-12-22 NOTE — Progress Notes (Signed)
Reviewed written d/c instructions w pt and all questions answered. He verbalized understanding. D/ C via w/c w all belongings in stable condition. 

## 2022-12-25 ENCOUNTER — Other Ambulatory Visit: Payer: Self-pay | Admitting: General Surgery

## 2022-12-25 ENCOUNTER — Other Ambulatory Visit (HOSPITAL_COMMUNITY): Payer: Self-pay | Admitting: General Surgery

## 2022-12-25 DIAGNOSIS — Z9889 Other specified postprocedural states: Secondary | ICD-10-CM

## 2022-12-25 LAB — SURGICAL PATHOLOGY

## 2022-12-25 MED ORDER — TRAMADOL HCL 50 MG PO TABS
50.0000 mg | ORAL_TABLET | Freq: Four times a day (QID) | ORAL | 0 refills | Status: AC | PRN
Start: 2022-12-25 — End: ?

## 2022-12-25 MED ORDER — TRAMADOL HCL 50 MG PO TABS
50.0000 mg | ORAL_TABLET | Freq: Four times a day (QID) | ORAL | 0 refills | Status: DC | PRN
Start: 2022-12-25 — End: 2022-12-25

## 2023-09-04 ENCOUNTER — Ambulatory Visit: Payer: BC Managed Care – PPO | Admitting: Family Medicine
